# Patient Record
Sex: Female | Born: 1980 | Hispanic: Yes | Marital: Married | Smoking: Never smoker
Health system: Southern US, Community
[De-identification: ages and names within clinical notes are randomized; demographics above are authoritative.]

## PROBLEM LIST (undated history)

## (undated) DIAGNOSIS — Z789 Other specified health status: Secondary | ICD-10-CM

## (undated) HISTORY — PX: NO PAST SURGERIES: SHX2092

## (undated) HISTORY — DX: Other specified health status: Z78.9

---

## 2012-07-27 NOTE — L&D Delivery Note (Signed)
  Carly Graham, Carly Graham [[id number]]  Delivery Note At 3:06 PM a viable female was delivered via Vaginal, Spontaneous Delivery (Presentation: Right Occiput Anterior).  APGAR: 9, 9; weight 5 lb 5.4 oz (2421 g).   Placenta status: Intact, Spontaneous.  Cord: 3 vessels with the following complications: None.     Mom to postpartum.  Baby to nursery-stable.  Tawana Scale 03/22/2013, 3:57 PM     Carly Graham, Carly Graham [[id number]]  Delivery Note At 3:09 PM a viable female was delivered via  (Presentation: breech; weight 7 lb 3.2 oz (3265 g).   After delivery of baby A, feet readily grabbed and AROM with clear fluid.  Normal breech extraction done.  Nuchal arm on right, reduced by rotation and delivery of left arm. Placenta status: Spontaneous.  Cord: 3-V    Anesthesia:  Local, fentanyl PP Episiotomy: None Lacerations: parallel 2nd degree tear repaired in usual fashion. Suture Repair: 3.0 vicryl On cT1 Est. Blood Loss (mL): 350  Mom to postpartum.  Baby to nursery-stable.  Twin A delviered vertex by Dr. Ike Bene, twin B delivered Breech by Dr. Shawnie Pons.  Active management of third stage with pit and traction and spont delivery of both placenta at the same time. Repaired 2nd deg in usual fashion. EBL 350. Spone count 10/10, instrument count 12.  Tawana Scale 03/22/2013, 3:57 PM

## 2012-10-04 LAB — CYTOLOGY - PAP: CYTOLOGY - PAP: NORMAL

## 2012-10-04 LAB — OB RESULTS CONSOLE ABO/RH: RH Type: POSITIVE

## 2012-10-04 LAB — OB RESULTS CONSOLE HEPATITIS B SURFACE ANTIGEN: Hepatitis B Surface Ag: NEGATIVE

## 2012-10-04 LAB — OB RESULTS CONSOLE RUBELLA ANTIBODY, IGM: Rubella: IMMUNE

## 2012-10-04 LAB — OB RESULTS CONSOLE ANTIBODY SCREEN: Antibody Screen: NEGATIVE

## 2012-10-04 LAB — OB RESULTS CONSOLE HIV ANTIBODY (ROUTINE TESTING): HIV: NONREACTIVE

## 2012-12-26 ENCOUNTER — Encounter: Payer: Self-pay | Admitting: Obstetrics and Gynecology

## 2012-12-26 ENCOUNTER — Ambulatory Visit (INDEPENDENT_AMBULATORY_CARE_PROVIDER_SITE_OTHER): Payer: Self-pay | Admitting: Obstetrics and Gynecology

## 2012-12-26 VITALS — BP 99/64 | Ht <= 58 in | Wt 154.2 lb

## 2012-12-26 DIAGNOSIS — O30002 Twin pregnancy, unspecified number of placenta and unspecified number of amniotic sacs, second trimester: Secondary | ICD-10-CM

## 2012-12-26 DIAGNOSIS — O0992 Supervision of high risk pregnancy, unspecified, second trimester: Secondary | ICD-10-CM

## 2012-12-26 DIAGNOSIS — O30009 Twin pregnancy, unspecified number of placenta and unspecified number of amniotic sacs, unspecified trimester: Secondary | ICD-10-CM

## 2012-12-26 DIAGNOSIS — O24419 Gestational diabetes mellitus in pregnancy, unspecified control: Secondary | ICD-10-CM | POA: Insufficient documentation

## 2012-12-26 LAB — POCT URINALYSIS DIP (DEVICE)
Glucose, UA: NEGATIVE mg/dL
Nitrite: NEGATIVE
Urobilinogen, UA: 0.2 mg/dL (ref 0.0–1.0)

## 2012-12-26 NOTE — Progress Notes (Signed)
Nutrition note: 1st visit consult Pt is having twins. Pt has gained 17.2# @ [redacted]w[redacted]d, which is wnl.  Pt reports eating 4x/d and drinks mostly water & milk but drinks a big sweet tea from [employer] 2-3x/wk. Pt is taking PNV. Pt reports no N/V or heartburn. NKFA. Pt received verbal & written education in Spanish on nutrition while pregnant with twins via Raynelle Fanning, Nurse, learning disability. Encouraged pt to eat 6-8x/d and encouraged pt to decrease intake of sweet tea. Discussed wt gain goals of 31-50# or 1.3#/wk. Pt agrees to continue taking PNV & try to eat small frequent meals. Pt has WIC & plans to BF. F/u in 4-6 wks Blondell Reveal, MS, RD, LDN

## 2012-12-26 NOTE — Progress Notes (Signed)
Patient transferred care from [facility] department secondary to twin pregnancy. Prenatal records reviewed. Uncomplicated first pregnancy. Need to obtain anatomy ultrasound record. Will schedule growth ultrasound since last ultrasound was on 11/23/2012. Patient is otherwise doing well without complaints.Reviewed signs/symptoms of preterm labor. FM precautions also reviewed. 28 week labs at next visit

## 2012-12-26 NOTE — Progress Notes (Signed)
U/S scheduled 01/17/13 at 930 am.

## 2012-12-26 NOTE — Progress Notes (Signed)
Pulse: 82

## 2012-12-27 ENCOUNTER — Encounter: Payer: Self-pay | Admitting: *Deleted

## 2013-01-02 ENCOUNTER — Other Ambulatory Visit: Payer: Self-pay

## 2013-01-16 ENCOUNTER — Encounter: Payer: Self-pay | Admitting: Obstetrics and Gynecology

## 2013-01-16 ENCOUNTER — Ambulatory Visit (INDEPENDENT_AMBULATORY_CARE_PROVIDER_SITE_OTHER): Payer: Self-pay | Admitting: Obstetrics and Gynecology

## 2013-01-16 VITALS — BP 109/73 | Temp 97.9°F | Wt 164.7 lb

## 2013-01-16 DIAGNOSIS — O30002 Twin pregnancy, unspecified number of placenta and unspecified number of amniotic sacs, second trimester: Secondary | ICD-10-CM

## 2013-01-16 DIAGNOSIS — O30009 Twin pregnancy, unspecified number of placenta and unspecified number of amniotic sacs, unspecified trimester: Secondary | ICD-10-CM

## 2013-01-16 DIAGNOSIS — O0992 Supervision of high risk pregnancy, unspecified, second trimester: Secondary | ICD-10-CM

## 2013-01-16 LAB — POCT URINALYSIS DIP (DEVICE)
Glucose, UA: 100 mg/dL — AB
Nitrite: NEGATIVE
Protein, ur: NEGATIVE mg/dL
Urobilinogen, UA: 0.2 mg/dL (ref 0.0–1.0)

## 2013-01-16 NOTE — Progress Notes (Signed)
Patient doing well without any complaints. FM/PTL precautions reviewed. F/u growth ultrasound tomorrow. Patient had a lot of concern regarding cost of ultrasounds and whether or not they were necessary during pregnancy. Explained to patient that it is our only way to monitor growth in multiple gestation. Will refer to [facility] for financial assistance. Patient to have 1 hr GCT at next visit

## 2013-01-16 NOTE — Progress Notes (Signed)
Pulse: 93

## 2013-01-17 ENCOUNTER — Ambulatory Visit (HOSPITAL_COMMUNITY)
Admission: RE | Admit: 2013-01-17 | Discharge: 2013-01-17 | Disposition: A | Discharge status: Home / Self Care | Payer: Self-pay | Source: Ambulatory Visit | Attending: Obstetrics and Gynecology | Admitting: Obstetrics and Gynecology

## 2013-01-17 ENCOUNTER — Encounter: Payer: Self-pay | Admitting: Obstetrics and Gynecology

## 2013-01-17 DIAGNOSIS — O30002 Twin pregnancy, unspecified number of placenta and unspecified number of amniotic sacs, second trimester: Secondary | ICD-10-CM

## 2013-01-17 DIAGNOSIS — O30009 Twin pregnancy, unspecified number of placenta and unspecified number of amniotic sacs, unspecified trimester: Secondary | ICD-10-CM | POA: Insufficient documentation

## 2013-01-17 DIAGNOSIS — Z3689 Encounter for other specified antenatal screening: Secondary | ICD-10-CM | POA: Insufficient documentation

## 2013-01-30 ENCOUNTER — Ambulatory Visit (INDEPENDENT_AMBULATORY_CARE_PROVIDER_SITE_OTHER): Payer: Self-pay | Admitting: Family Medicine

## 2013-01-30 VITALS — BP 107/61 | Temp 97.6°F | Wt 164.0 lb

## 2013-01-30 DIAGNOSIS — O30002 Twin pregnancy, unspecified number of placenta and unspecified number of amniotic sacs, second trimester: Secondary | ICD-10-CM

## 2013-01-30 DIAGNOSIS — O30009 Twin pregnancy, unspecified number of placenta and unspecified number of amniotic sacs, unspecified trimester: Secondary | ICD-10-CM

## 2013-01-30 LAB — CBC
HCT: 32.5 % — ABNORMAL LOW (ref 36.0–46.0)
Hemoglobin: 11.4 g/dL — ABNORMAL LOW (ref 12.0–15.0)
MCH: 30.4 pg (ref 26.0–34.0)
MCHC: 35.1 g/dL (ref 30.0–36.0)

## 2013-01-30 LAB — POCT URINALYSIS DIP (DEVICE)
Ketones, ur: NEGATIVE mg/dL
Protein, ur: NEGATIVE mg/dL
Specific Gravity, Urine: 1.015 (ref 1.005–1.030)

## 2013-01-30 NOTE — Progress Notes (Signed)
Feeling tired, feet and back hurt. Last ultrasound showed concordant growth. Due for f/u ultrasound this week. 28-week labs. Worried about cost of care - awaiting results of application for financial assistance. Explained importance of monitoring twins for discordant growth. q 2 week growth sonos until 32 weeks then q 3 weeks and twice weekly testing starting at 32 weeks.

## 2013-01-30 NOTE — Progress Notes (Signed)
Pulse 94 Edema trace in lower extremities. C/o lower back and hip pain.

## 2013-01-30 NOTE — Patient Instructions (Addendum)
Embarazo  Tercer trimestre  (Pregnancy - Third Trimester) El tercer trimestre del embarazo (los ltimos 3 meses) es el perodo en el cual tanto usted como su beb crecen con ms rapidez. El beb alcanza un largo de aproximadamente 50 cm. y pesa entre 2,700 y 4,500 kg. El beb gana ms tejido graso y est listo para la vida fuera del cuerpo de la madre. Mientras estn en el interior, los bebs tienen perodos de sueo y vigilia, succionan el pulgar y tienen hipo. Quizs sienta pequeas contracciones del tero. Este es el falso trabajo de parto. Tambin se las conoce como contracciones de Braxton-Hicks . Es como una prctica del parto. Los problemas ms habituales de esta etapa del embarazo incluyen mayor dificultad para respirar, hinchazn de las manos y los pies por retencin de lquidos y la necesidad de orinar con ms frecuencia debido a que el tero y el beb presionan sobre la vejiga.  EXAMENES PRENATALES   Durante los exmenes prenatales, deber seguir realizndose anlisis de sangre. Estas pruebas se realizan para controlar su salud y la del beb. Los anlisis de sangre se realizan para conocer los niveles de algunos compuestos de la sangre (hemoglobina). La anemia (bajo nivel de hemoglobina) es frecuente durante el embarazo. Para prevenirla, se administran hierro y vitaminas. Tambin le tomarn nuevas anlisis para descartar diabetes. Podrn repetirle algunas de las pruebas que le hicieron previamente.  En cada visita le medirn el tamao del tero. Esto permite asegurar que el beb se desarrolla adecuadamente, segn la fecha del embarazo.  Le controlarn la presin arterial en cada visita prenatal. Esto es para asegurarse de que no sufre toxemia.  Le harn un anlisis de orina en cada visita prenatal, para descartar infecciones, diabetes y la presencia de protenas.  Tambin en cada visita controlarn su peso. Esto se realiza para asegurarse que aumenta de peso al ritmo indicado y que usted y su  beb evolucionan normalmente.  En algunas ocasiones se realiza una prueba de ultrasonido para confirmar el correcto desarrollo y evolucin del beb. Esta prueba se realiza con ondas sonoras inofensivas para el beb, de modo que el profesional pueda calcular ms precisamente la fecha del parto.  Analice con su mdico los analgsicos y la anestesia que recibir durante el trabajo de parto y el parto.  Comente la posibilidad de que necesite una cesrea y qu anestesia se recibir.  Informe a su mdico si sufre violencia familiar mental o fsica. A veces, se indica la prueba especializada sin estrs, la prueba de tolerancia a las contracciones y el perfil biofsico para asegurarse de que el beb no tiene problemas. El estudio del lquido amnitico que rodea al beb se llama amniocentesis. El lquido amnitico se obtiene introduciendo una aguja en el vientre (abdomen ). En ocasiones se lleva a cabo cerca del final del embarazo, si es necesario inducir a un parto. En este caso se realiza para asegurarse que los pulmones del beb estn lo suficientemente maduros como para que pueda vivir fuera del tero. Si los pulmones no han madurado y es peligroso que el beb nazca, se administrar a la madre una inyeccin de cortisona , 1 a 2 das antes del parto. . Esto ayuda a que los pulmones del beb maduren y sea ms seguro su nacimiento.  CAMBIOS QUE OCURREN EN EL TERCER TRIMESTRE DEL EMBARAZO  Su organismo atravesar numerosos cambios durante el embarazo. Estos pueden variar de una persona a otra. Converse con el profesional que la asiste acerca los cambios que   usted note y que la preocupen.   Durante el ltimo trimestre probablemente sienta un aumento del apetito. Es normal tener "antojos" de ciertas comidas. Esto vara de una persona a otra y de un embarazo a otro.  Podrn aparecer las primeras estras en las caderas, abdomen y mamas. Estos son cambios normales del cuerpo durante el embarazo. No existen  medicamentos ni ejercicios que puedan prevenir estos cambios.  La constipacin puede tratarse con un laxante o agregando fibra a su dieta. Beber grandes cantidades de lquidos, tomar fibras en forma de vegetales, frutas y granos integrales es de gran ayuda.  Tambin es beneficioso practicar actividad fsica. Si ha sido una persona activa hasta el embarazo, podr continuar con la mayora de las actividades durante el mismo. Si ha sido menos activa, puede ser beneficioso que comience con un programa de ejercicios, como realizar caminatas. Consulte con el profesional que la asiste antes de comenzar un programa de ejercicios.  Evite el consumo de cigarrillos, el alcohol, los medicamentos no recetados y las "drogas de la calle" durante el embarazo. Estas sustancias qumicas afectan la formacin y el desarrollo del beb. Evite estas sustancias durante todo el embarazo para asegurar el nacimiento de un beb sano.  Podr sentir dolor de espalda, tener vrices en las venas y hemorroides, o si ya los sufra, pueden empeorar.  Durante el tercer trimestre se cansar con ms facilidad, lo cual es normal.  Los movimientos del beb pueden ser ms fuertes y con ms frecuencia.  Puede que note dificultades para respirar normalmente.  El ombligo puede salir hacia afuera.  A veces sale una secrecin amarilla de las mamas, que se llama calostro.  Podr aparecer una secrecin mucosa con sangre. Esto suele ocurrir entre unos pocos das y una semana antes del parto. INSTRUCCIONES PARA EL CUIDADO EN EL HOGAR   Cumpla con las citas de control. Siga las indicaciones del mdico con respecto al uso de medicamentos, los ejercicios y la dieta.  Durante el embarazo debe obtener nutrientes para usted y para su beb. Consuma alimentos balanceados a intervalos regulares. Elija alimentos como carne, pescado, leche y otros productos lcteos descremados, vegetales, frutas, panes integrales y cereales. El mdico le informar  cul es el aumento de peso ideal.  Las relaciones sexuales pueden continuarse hasta casi el final del embarazo, si no se presentan otros problemas como prdida prematura (antes de tiempo) de lquido amnitico, hemorragia vaginal o dolor en el vientre (abdominal).  Realice actividad fsica todos los das, si no tiene restricciones. Consulte con el profesional que la asiste si no sabe con certeza si determinados ejercicios son seguros. El mayor aumento de peso se producir en los ltimos 2 trimestres del embarazo. El ejercicio ayuda a:  Controlar su peso.  Mantenerse en forma para el trabajo de parto y el parto .  Perder peso despus del parto.  Haga reposo con frecuencia, con las piernas elevadas, o segn lo necesite para evitar los calambres y el dolor de cintura.  Use un buen sostn o como los que se usan para hacer deportes para aliviar la sensibilidad de las mamas. Tambin puede serle til si lo usa mientras duerme. Si pierde calostro, podr utilizar apsitos en el sostn.  No utilice la baera con agua caliente, baos turcos y saunas.   Colquese el cinturn de seguridad cuando conduzca. Este la proteger a usted y al beb en caso de accidente.  Evite comer carne cruda y el contacto con los utensilios y desperdicios de los gatos. Estos   elementos contienen grmenes que pueden causar defectos de nacimiento en el beb.  Es fcil perder algo de orina durante el embarazo. Apretar y fortalecer los msculos de la pelvis la ayudar con este problema. Practique detener la miccin cuando est en el bao. Estos son los mismos msculos que necesita fortalecer. Son tambin los mismos msculos que utiliza cuando trata de evitar despedir gases. Puede practicar apretando estos msculos diez veces, y repetir esto tres veces por da aproximadamente. Una vez que conozca qu msculos debe apretar, no realice estos ejercicios durante la miccin. Puede favorecerle una infeccin si la orina vuelve hacia  atrs.  Pida ayuda si tienen necesidades financieras, teraputicas o nutricionales. El profesional podr ayudarla con respecto a estas necesidades, o derivarla a otros especialistas.  Haga una lista de nmeros telefnicos de emergencia y tngalos disponibles.  Planifique como obtener ayuda de familiares o amigos cuando regrese a casa desde el hospital.  Hacer un ensayo sobre la partida al hospital.  Tome clases prenatales con el padre para entender, practicar y hacer preguntas sobre el trabajo de parto y el alumbramiento.  Preparar la habitacin del beb / busque una guardera.  No viaje fuera de la ciudad a menos que sea absolutamente necesario y con el asesoramiento de su mdico.  Use slo zapatos de tacn bajo o sin tacn para tener mejor equilibrio y evitar cadas. USO DE MEDICAMENTOS Y CONSUMO DE DROGAS DURANTE EL EMBARAZO   Tome las vitaminas apropiadas para esta etapa tal como se le indic. Las vitaminas deben contener un miligramo de cido flico. Guarde todas las vitaminas fuera del alcance de los nios. La ingestin de slo un par de vitaminas o tabletas que contengan hierro pueden ocasionar la muerte en un beb o en un nio pequeo.  Evite el uso de todos los medicamentos, incluyendo hierbas, medicamentos de venta libre, sin receta o que no hayan sido sugeridos por su mdico. Slo tome medicamentos de venta libre o medicamentos recetados para el dolor, el malestar o fiebre como lo indique su mdico. No tome aspirina, ibuprofeno o naproxeno excepto que su mdico se lo indique.  Infrmele al profesional si consume alguna droga.  El alcohol se relaciona con ciertos defectos congnitos. Incluye el sndrome de alcoholismo fetal. Debe evitar absolutamente el consumo de alcohol, en cualquier forma. El fumar produce baja tasa de natalidad y bebs prematuros.  Las drogas ilegales o de la calle son muy perjudiciales para el beb. Estn absolutamente prohibidas. Un beb que nace de una  madre adicta, ser adicto al nacer. Ese beb tendr los mismos sntomas de abstinencia que un adulto. SOLICITE ATENCIN MDICA SI:  Tiene preguntas o preocupaciones relacionadas con el embarazo. Es mejor que llame para formular las preguntas si no puede esperar hasta la prxima visita, que sentirse preocupada por ellas.  SOLICITE ATENCIN MDICA DE INMEDIATO SI:   La temperatura oral le sube a ms de 38,9 C (102 F) o lo que su mdico le indique.  Tiene una prdida de lquido por la vagina (canal de parto). Si sospecha una ruptura de las membranas, tmese la temperatura y llame al profesional para informarlo sobre esto.  Observa unas pequeas manchas, una hemorragia vaginal o elimina cogulos. Notifique al profesional acerca de la cantidad y de cuntos apsitos est utilizando.  Presenta un olor desagradable en la secrecin vaginal y observa un cambio en el color, de transparente a blanco.  Ha vomitado durante ms de 24 horas.  Siente escalofros o le sube la fiebre.  Le   falta el aire.  Siente ardor al orinar.  Baja o sube ms de 2 libras (900 g), o segn lo indicado por el profesional que la asiste.  Observa que sbitamente se le hinchan el rostro, las manos, los pies o las piernas.  Siente dolor en el vientre (abdominal). Las molestias en el ligamento redondo son una causa benigna frecuente de dolor abdominal durante el embarazo. El profesional que la asiste deber evaluarla.  Presenta dolor de cabeza intenso que no se alivia.  Tiene problemas visuales, visin doble o borrosa.  Si no siente los movimientos del beb durante ms de 1 hora. Si piensa que el beb no se mueve tanto como lo haca habitualmente, coma algo que contenga azcar y recustese sobre el lado izquierdo durante una hora. El beb debe moverse al menos 4  5 veces por hora. Comunquese inmediatamente si el beb se mueve menos que lo indicado.  Se cae, se ve involucrada en un accidente automovilstico o sufre algn  tipo de traumatismo.  En su hogar hay violencia mental o fsica. Document Released: 04/22/2005 Document Revised: 04/06/2012 [employer] Patient Information 2014 [employer].  

## 2013-01-31 LAB — HIV ANTIBODY (ROUTINE TESTING W REFLEX): HIV: NONREACTIVE

## 2013-01-31 LAB — GLUCOSE TOLERANCE, 1 HOUR (50G) W/O FASTING: Glucose, 1 Hour GTT: 117 mg/dL (ref 70–140)

## 2013-02-02 ENCOUNTER — Telehealth: Payer: Self-pay | Admitting: *Deleted

## 2013-02-02 ENCOUNTER — Encounter: Payer: Self-pay | Admitting: Family Medicine

## 2013-02-02 NOTE — Telephone Encounter (Signed)
Called and scheduled ultrasound for first available appointment 02/08/13

## 2013-02-02 NOTE — Telephone Encounter (Signed)
[place] is 02/08/13 at 9:30 am. Need to call with Spanish interpreter

## 2013-02-02 NOTE — Telephone Encounter (Signed)
Message copied by Gerome Apley on Thu Feb 02, 2013  3:51 PM ------      Message from: FERRY, [place]      Created: Mon Jan 30, 2013 11:43 PM      Regarding: needs ultrasound scheduled for f/u growth this week or early next       Ultrasound for growth ordered. Needs this week or early next. Will order through [facility]. ------

## 2013-02-06 NOTE — Telephone Encounter (Signed)
Used Carly Graham as Equities trader. Called patient; no answer. Left message in VM of her ultrasound appt on 02/08/13 @0930 . Left number of clinic to call us back if she has any further questions.

## 2013-02-08 ENCOUNTER — Ambulatory Visit (HOSPITAL_COMMUNITY)
Admission: RE | Admit: 2013-02-08 | Discharge: 2013-02-08 | Disposition: A | Discharge status: Home / Self Care | Payer: Self-pay | Source: Ambulatory Visit | Attending: Family Medicine | Admitting: Family Medicine

## 2013-02-08 VITALS — BP 116/66 | HR 93 | Wt 169.5 lb

## 2013-02-08 DIAGNOSIS — N83209 Unspecified ovarian cyst, unspecified side: Secondary | ICD-10-CM | POA: Insufficient documentation

## 2013-02-08 DIAGNOSIS — O30002 Twin pregnancy, unspecified number of placenta and unspecified number of amniotic sacs, second trimester: Secondary | ICD-10-CM

## 2013-02-08 DIAGNOSIS — O30009 Twin pregnancy, unspecified number of placenta and unspecified number of amniotic sacs, unspecified trimester: Secondary | ICD-10-CM | POA: Insufficient documentation

## 2013-02-08 DIAGNOSIS — Z3689 Encounter for other specified antenatal screening: Secondary | ICD-10-CM | POA: Insufficient documentation

## 2013-02-08 DIAGNOSIS — O34599 Maternal care for other abnormalities of gravid uterus, unspecified trimester: Secondary | ICD-10-CM | POA: Insufficient documentation

## 2013-02-08 NOTE — Progress Notes (Signed)
Shaolin Mozel Burdett  was seen today for an ultrasound appointment.  See full report in [facility].  Impression: MC/DA twin gestation with best dates of 31 1/7 weeks 29% inter twin growth discordance is noted  Twin A: Maternal left, cephalic, anterior placenta Estimated fetal weight at the 29th %tile. No anomalies identied; limited views of the anatomy obtained due to late gestational age and position Normal amniotic fluid volume  Twin B: Maternal right, breech, anterior placenta Estimated fetal weight at the 55th %tile. No anomalies identified; limited views of the anatomy obtained due to late gestational age and position Normal amniotic fluid volume  Recommendations: The patient is unable to be seen for 2x weekly NSTs due to transportation issues.  She will return for weekly BPPs and fluid checks. Follow up ultrasound for interval growth in 3-4 weeks.  Recommend delivery at 37 weeks if testing otherwise reassuring.  Alpha Gula, MD

## 2013-02-13 ENCOUNTER — Encounter: Payer: Self-pay | Admitting: Obstetrics and Gynecology

## 2013-02-13 ENCOUNTER — Ambulatory Visit (INDEPENDENT_AMBULATORY_CARE_PROVIDER_SITE_OTHER): Payer: Self-pay | Admitting: Obstetrics and Gynecology

## 2013-02-13 VITALS — BP 104/70 | Temp 99.2°F | Wt 170.0 lb

## 2013-02-13 DIAGNOSIS — O0992 Supervision of high risk pregnancy, unspecified, second trimester: Secondary | ICD-10-CM

## 2013-02-13 DIAGNOSIS — O30002 Twin pregnancy, unspecified number of placenta and unspecified number of amniotic sacs, second trimester: Secondary | ICD-10-CM

## 2013-02-13 DIAGNOSIS — O30009 Twin pregnancy, unspecified number of placenta and unspecified number of amniotic sacs, unspecified trimester: Secondary | ICD-10-CM

## 2013-02-13 DIAGNOSIS — O24419 Gestational diabetes mellitus in pregnancy, unspecified control: Secondary | ICD-10-CM

## 2013-02-13 DIAGNOSIS — O99810 Abnormal glucose complicating pregnancy: Secondary | ICD-10-CM

## 2013-02-13 LAB — POCT URINALYSIS DIP (DEVICE)
Glucose, UA: NEGATIVE mg/dL
Specific Gravity, Urine: 1.01 (ref 1.005–1.030)
Urobilinogen, UA: 0.2 mg/dL (ref 0.0–1.0)

## 2013-02-13 NOTE — Progress Notes (Signed)
Pulse 86 Edema trace in  Hands and feet especially the left foot. C/o low back pain.

## 2013-02-13 NOTE — Progress Notes (Signed)
Patient doing well without complaints. FM/PTL precautions reviewed. Patient scheduled for weekly BPP with AFI on Wednesdays, in lieu of twice weekly testing (as she is having transportation issues)

## 2013-02-14 ENCOUNTER — Other Ambulatory Visit (HOSPITAL_COMMUNITY): Payer: Self-pay | Admitting: Maternal and Fetal Medicine

## 2013-02-14 DIAGNOSIS — O30002 Twin pregnancy, unspecified number of placenta and unspecified number of amniotic sacs, second trimester: Secondary | ICD-10-CM

## 2013-02-15 ENCOUNTER — Ambulatory Visit (HOSPITAL_COMMUNITY)
Admission: RE | Admit: 2013-02-15 | Discharge: 2013-02-15 | Disposition: A | Discharge status: Home / Self Care | Payer: Self-pay | Source: Ambulatory Visit | Attending: Obstetrics and Gynecology | Admitting: Obstetrics and Gynecology

## 2013-02-15 VITALS — BP 110/65 | HR 69 | Wt 172.0 lb

## 2013-02-15 DIAGNOSIS — O30009 Twin pregnancy, unspecified number of placenta and unspecified number of amniotic sacs, unspecified trimester: Secondary | ICD-10-CM | POA: Insufficient documentation

## 2013-02-15 DIAGNOSIS — O30039 Twin pregnancy, monochorionic/diamniotic, unspecified trimester: Secondary | ICD-10-CM | POA: Insufficient documentation

## 2013-02-15 DIAGNOSIS — O30002 Twin pregnancy, unspecified number of placenta and unspecified number of amniotic sacs, second trimester: Secondary | ICD-10-CM

## 2013-02-15 DIAGNOSIS — O30033 Twin pregnancy, monochorionic/diamniotic, third trimester: Secondary | ICD-10-CM

## 2013-02-15 NOTE — Progress Notes (Signed)
[facility] ultrasound  Indication: 32 yr old G3P1011 at [redacted]w[redacted]d with monochorionic/diamniotic twin gestation for BPPs.  Findings: 1. Monochorionic/diamniotic twin gestation; the dividing membrane is seen. 2. Anterior placenta without evidence of previa. 3. Normal amniotic fluid volume for both fetuses. 4. Normal stomach and bladder seen in each fetus. 5. Normal biophysical profiles for both fetuses of 8/8.  Recommendations: 1. Monochorionic/diamniotic twin gestation: - previously counseled - no evidence of twin twin transfusion on today's ultrasound; continue close surveillance - recommend fetal growth in 2 weeks - recommend continue antenatal testing - recommend delivery at 37 weeks if clinical picture remains stable - recommend preterm labor precautions  Eulis Foster, MD

## 2013-02-20 ENCOUNTER — Encounter: Payer: Self-pay | Admitting: Family Medicine

## 2013-02-21 ENCOUNTER — Other Ambulatory Visit (HOSPITAL_COMMUNITY): Payer: Self-pay | Admitting: Obstetrics and Gynecology

## 2013-02-22 ENCOUNTER — Ambulatory Visit (HOSPITAL_COMMUNITY)
Admission: RE | Admit: 2013-02-22 | Discharge: 2013-02-22 | Disposition: A | Discharge status: Home / Self Care | Payer: Self-pay | Source: Ambulatory Visit | Attending: Obstetrics and Gynecology | Admitting: Obstetrics and Gynecology

## 2013-02-22 ENCOUNTER — Inpatient Hospital Stay (HOSPITAL_COMMUNITY): Admission: RE | Admit: 2013-02-22 | Payer: Self-pay | Source: Ambulatory Visit

## 2013-02-22 DIAGNOSIS — O30009 Twin pregnancy, unspecified number of placenta and unspecified number of amniotic sacs, unspecified trimester: Secondary | ICD-10-CM | POA: Insufficient documentation

## 2013-02-22 DIAGNOSIS — O30033 Twin pregnancy, monochorionic/diamniotic, third trimester: Secondary | ICD-10-CM

## 2013-02-22 DIAGNOSIS — N83209 Unspecified ovarian cyst, unspecified side: Secondary | ICD-10-CM | POA: Insufficient documentation

## 2013-02-22 DIAGNOSIS — O34599 Maternal care for other abnormalities of gravid uterus, unspecified trimester: Secondary | ICD-10-CM | POA: Insufficient documentation

## 2013-02-27 ENCOUNTER — Encounter: Payer: Self-pay | Admitting: Family Medicine

## 2013-02-27 ENCOUNTER — Encounter: Payer: Self-pay | Admitting: Obstetrics & Gynecology

## 2013-02-27 ENCOUNTER — Ambulatory Visit (INDEPENDENT_AMBULATORY_CARE_PROVIDER_SITE_OTHER): Payer: Self-pay | Admitting: Family Medicine

## 2013-02-27 VITALS — BP 101/66 | Wt 173.8 lb

## 2013-02-27 DIAGNOSIS — O30002 Twin pregnancy, unspecified number of placenta and unspecified number of amniotic sacs, second trimester: Secondary | ICD-10-CM

## 2013-02-27 DIAGNOSIS — O30009 Twin pregnancy, unspecified number of placenta and unspecified number of amniotic sacs, unspecified trimester: Secondary | ICD-10-CM

## 2013-02-27 LAB — POCT URINALYSIS DIP (DEVICE)
Bilirubin Urine: NEGATIVE
Hgb urine dipstick: NEGATIVE
Ketones, ur: NEGATIVE mg/dL
Nitrite: NEGATIVE
pH: 7 (ref 5.0–8.0)

## 2013-02-27 NOTE — Progress Notes (Signed)
Pt is a 32 yo G3P1011 @ [redacted]w[redacted]d with mono/di twins.  - no ctx, lof, vb - +FM  O; see above  A/P:  - getting weekly BPP and AFI on Wednesdays due to transportation issues instead of twice weekly BPPs - has a growth scan for this week- last one was concordant - f/u @ 36 weeks with cultures at that time - discussed BTL- pt does not need papers.   - FM/PTL precautions discussed.

## 2013-02-27 NOTE — Progress Notes (Signed)
Pulse: 83

## 2013-02-27 NOTE — Patient Instructions (Signed)

## 2013-02-28 ENCOUNTER — Other Ambulatory Visit (HOSPITAL_COMMUNITY): Payer: Self-pay | Admitting: Maternal and Fetal Medicine

## 2013-02-28 DIAGNOSIS — O30002 Twin pregnancy, unspecified number of placenta and unspecified number of amniotic sacs, second trimester: Secondary | ICD-10-CM

## 2013-03-01 ENCOUNTER — Ambulatory Visit (HOSPITAL_COMMUNITY)
Admission: RE | Admit: 2013-03-01 | Discharge: 2013-03-01 | Disposition: A | Discharge status: Home / Self Care | Payer: Self-pay | Source: Ambulatory Visit | Attending: Obstetrics and Gynecology | Admitting: Obstetrics and Gynecology

## 2013-03-01 VITALS — BP 98/58 | HR 102 | Wt 178.5 lb

## 2013-03-01 DIAGNOSIS — O30039 Twin pregnancy, monochorionic/diamniotic, unspecified trimester: Secondary | ICD-10-CM | POA: Insufficient documentation

## 2013-03-01 DIAGNOSIS — O30009 Twin pregnancy, unspecified number of placenta and unspecified number of amniotic sacs, unspecified trimester: Secondary | ICD-10-CM | POA: Insufficient documentation

## 2013-03-01 DIAGNOSIS — Z3689 Encounter for other specified antenatal screening: Secondary | ICD-10-CM | POA: Insufficient documentation

## 2013-03-01 DIAGNOSIS — O30002 Twin pregnancy, unspecified number of placenta and unspecified number of amniotic sacs, second trimester: Secondary | ICD-10-CM

## 2013-03-01 NOTE — Progress Notes (Signed)
Carly Graham  was seen today for an ultrasound appointment.  See full report in [facility].  Impression: MC/DA twin gestation with best dates of 34 1/7 weeks Interval fetal growth is concordant  Twin A: Maternal left, cephalic, anterior placenta Active fetus with BPP of 8/8 Interval growth is appropriate (31st %tile) Normal amniotic fluid volume  Twin B: Maternal right, breech, anterior placenta Active fetus with BPP of 8/9 Interval growth is appropriate (50th %tile) Normal amniotic fluid volume  Recommendations: Continue weekly BPPs Recommend delivery at 37 weeks if otherwise uncomplicated.  Alpha Gula, MD

## 2013-03-08 ENCOUNTER — Ambulatory Visit (HOSPITAL_COMMUNITY)
Admission: RE | Admit: 2013-03-08 | Discharge: 2013-03-08 | Disposition: A | Discharge status: Home / Self Care | Payer: Self-pay | Source: Ambulatory Visit | Attending: Obstetrics and Gynecology | Admitting: Obstetrics and Gynecology

## 2013-03-08 ENCOUNTER — Other Ambulatory Visit (HOSPITAL_COMMUNITY): Payer: Self-pay | Admitting: Maternal and Fetal Medicine

## 2013-03-08 ENCOUNTER — Encounter (HOSPITAL_COMMUNITY): Payer: Self-pay

## 2013-03-08 VITALS — BP 141/87 | HR 100 | Wt 178.0 lb

## 2013-03-08 DIAGNOSIS — O30002 Twin pregnancy, unspecified number of placenta and unspecified number of amniotic sacs, second trimester: Secondary | ICD-10-CM

## 2013-03-08 DIAGNOSIS — O30039 Twin pregnancy, monochorionic/diamniotic, unspecified trimester: Secondary | ICD-10-CM | POA: Insufficient documentation

## 2013-03-08 DIAGNOSIS — Z3689 Encounter for other specified antenatal screening: Secondary | ICD-10-CM | POA: Insufficient documentation

## 2013-03-08 DIAGNOSIS — O30009 Twin pregnancy, unspecified number of placenta and unspecified number of amniotic sacs, unspecified trimester: Secondary | ICD-10-CM | POA: Insufficient documentation

## 2013-03-08 NOTE — Progress Notes (Signed)
[facility] ultrasound  Indication: 32 yr old G3P1011 at [redacted]w[redacted]d with monochorionic/diamniotic twin gestation for BPPs.  Findings: 1. Monochorionic/diamniotic twin gestation; the dividing membrane is seen. 2. Anterior placenta without evidence of previa. 3. Normal amniotic fluid volume for both fetuses. 4. Normal stomach and bladder seen in each fetus. 5. Normal biophysical profiles for both fetuses of 8/8. 6. Twin A is in cephalic presentation; twin B is in breech presentation.  Recommendations: 1. Monochorionic/diamniotic twin gestation: - previously counseled - no evidence of twin twin transfusion on today's ultrasound; continue close surveillance - recent growth was normal with 10% discordance - recommend continue antenatal testing - recommend delivery at 37 weeks if clinical picture remains stable - recommend preterm labor precautions  Patient reports nasal and throat congestion. no fever. Recommend try claritin, allegra, or zyrtec. If does not improve, develops a fever, or sore throat to be evaluated by primary doctor.   Eulis Foster, MD

## 2013-03-13 ENCOUNTER — Ambulatory Visit (INDEPENDENT_AMBULATORY_CARE_PROVIDER_SITE_OTHER): Payer: Self-pay | Admitting: Family

## 2013-03-13 VITALS — BP 107/68 | Temp 98.2°F | Wt 180.2 lb

## 2013-03-13 DIAGNOSIS — O30002 Twin pregnancy, unspecified number of placenta and unspecified number of amniotic sacs, second trimester: Secondary | ICD-10-CM

## 2013-03-13 DIAGNOSIS — O30009 Twin pregnancy, unspecified number of placenta and unspecified number of amniotic sacs, unspecified trimester: Secondary | ICD-10-CM

## 2013-03-13 DIAGNOSIS — O30003 Twin pregnancy, unspecified number of placenta and unspecified number of amniotic sacs, third trimester: Secondary | ICD-10-CM

## 2013-03-13 DIAGNOSIS — O0992 Supervision of high risk pregnancy, unspecified, second trimester: Secondary | ICD-10-CM

## 2013-03-13 LAB — OB RESULTS CONSOLE GC/CHLAMYDIA: Chlamydia: NEGATIVE

## 2013-03-13 LAB — POCT URINALYSIS DIP (DEVICE)
Bilirubin Urine: NEGATIVE
Glucose, UA: NEGATIVE mg/dL
Specific Gravity, Urine: 1.015 (ref 1.005–1.030)
Urobilinogen, UA: 0.2 mg/dL (ref 0.0–1.0)

## 2013-03-13 NOTE — Progress Notes (Signed)
Pulse: 83

## 2013-03-13 NOTE — Progress Notes (Signed)
Reports increased pressure; does not feel contractions, increased movement.  Reviewed preterm labor precautions; BPP/AFI on Wednesday; GBS and GC/CT collected today.

## 2013-03-14 LAB — GC/CHLAMYDIA PROBE AMP: CT Probe RNA: NEGATIVE

## 2013-03-15 ENCOUNTER — Ambulatory Visit (HOSPITAL_COMMUNITY)
Admission: RE | Admit: 2013-03-15 | Discharge: 2013-03-15 | Disposition: A | Discharge status: Home / Self Care | Payer: Self-pay | Source: Ambulatory Visit | Attending: Obstetrics and Gynecology | Admitting: Obstetrics and Gynecology

## 2013-03-15 DIAGNOSIS — O30002 Twin pregnancy, unspecified number of placenta and unspecified number of amniotic sacs, second trimester: Secondary | ICD-10-CM

## 2013-03-15 DIAGNOSIS — O30009 Twin pregnancy, unspecified number of placenta and unspecified number of amniotic sacs, unspecified trimester: Secondary | ICD-10-CM | POA: Insufficient documentation

## 2013-03-15 DIAGNOSIS — Z3689 Encounter for other specified antenatal screening: Secondary | ICD-10-CM | POA: Insufficient documentation

## 2013-03-15 DIAGNOSIS — O30039 Twin pregnancy, monochorionic/diamniotic, unspecified trimester: Secondary | ICD-10-CM | POA: Insufficient documentation

## 2013-03-15 NOTE — Progress Notes (Signed)
Carly Graham  was seen today for an ultrasound appointment.  See full report in [facility].  Impression: MC/DA twin gestation at 107 1/7 weeks Cephalic/ Breech presentation BPP 8/8 x 2 Normal amniotic fluid volume x 2  Recommendations: Follow-up ultrasounds as clinically indicated.  Recommend delivery at 37 weeks if otherwise stable.  Alpha Gula, MD

## 2013-03-16 LAB — CULTURE, BETA STREP (GROUP B ONLY)

## 2013-03-17 ENCOUNTER — Encounter: Payer: Self-pay | Admitting: Family

## 2013-03-20 ENCOUNTER — Ambulatory Visit (INDEPENDENT_AMBULATORY_CARE_PROVIDER_SITE_OTHER): Payer: Self-pay | Admitting: Family Medicine

## 2013-03-20 ENCOUNTER — Encounter: Payer: Self-pay | Admitting: Family Medicine

## 2013-03-20 ENCOUNTER — Telehealth (HOSPITAL_COMMUNITY): Payer: Self-pay | Admitting: *Deleted

## 2013-03-20 ENCOUNTER — Encounter (HOSPITAL_COMMUNITY): Payer: Self-pay | Admitting: *Deleted

## 2013-03-20 VITALS — BP 109/72 | Temp 97.5°F | Wt 180.1 lb

## 2013-03-20 DIAGNOSIS — O30002 Twin pregnancy, unspecified number of placenta and unspecified number of amniotic sacs, second trimester: Secondary | ICD-10-CM

## 2013-03-20 DIAGNOSIS — O30009 Twin pregnancy, unspecified number of placenta and unspecified number of amniotic sacs, unspecified trimester: Secondary | ICD-10-CM

## 2013-03-20 LAB — POCT URINALYSIS DIP (DEVICE)
Bilirubin Urine: NEGATIVE
Nitrite: NEGATIVE
Protein, ur: NEGATIVE mg/dL
pH: 7 (ref 5.0–8.0)

## 2013-03-20 NOTE — Telephone Encounter (Signed)
Preadmission screen Interpreter number [phone]

## 2013-03-20 NOTE — Assessment & Plan Note (Signed)
Delivery at 37 wks.

## 2013-03-20 NOTE — Progress Notes (Signed)
Needs IOL scheduled.  Babies are vtx, breech.  Recommend scheduling for Wednesday.

## 2013-03-20 NOTE — Patient Instructions (Signed)
Embarazo  Tercer trimestre  (Pregnancy - Third Trimester) El tercer trimestre del embarazo (los ltimos 3 meses) es el perodo en el cual tanto usted como su beb crecen con ms rapidez. El beb alcanza un largo de aproximadamente 50 cm. y pesa entre 2,700 y 4,500 kg. El beb gana ms tejido graso y est listo para la vida fuera del cuerpo de la madre. Mientras estn en el interior, los bebs tienen perodos de sueo y vigilia, succionan el pulgar y tienen hipo. Quizs sienta pequeas contracciones del tero. Este es el falso trabajo de parto. Tambin se las conoce como contracciones de Braxton-Hicks . Es como una prctica del parto. Los problemas ms habituales de esta etapa del embarazo incluyen mayor dificultad para respirar, hinchazn de las manos y los pies por retencin de lquidos y la necesidad de orinar con ms frecuencia debido a que el tero y el beb presionan sobre la vejiga.  EXAMENES PRENATALES   Durante los exmenes prenatales, deber seguir realizndose anlisis de sangre. Estas pruebas se realizan para controlar su salud y la del beb. Los anlisis de sangre se realizan para conocer los niveles de algunos compuestos de la sangre (hemoglobina). La anemia (bajo nivel de hemoglobina) es frecuente durante el embarazo. Para prevenirla, se administran hierro y vitaminas. Tambin le tomarn nuevas anlisis para descartar diabetes. Podrn repetirle algunas de las pruebas que le hicieron previamente.  En cada visita le medirn el tamao del tero. Esto permite asegurar que el beb se desarrolla adecuadamente, segn la fecha del embarazo.  Le controlarn la presin arterial en cada visita prenatal. Esto es para asegurarse de que no sufre toxemia.  Le harn un anlisis de orina en cada visita prenatal, para descartar infecciones, diabetes y la presencia de protenas.  Tambin en cada visita controlarn su peso. Esto se realiza para asegurarse que aumenta de peso al ritmo indicado y que usted y  su beb evolucionan normalmente.  En algunas ocasiones se realiza una prueba de ultrasonido para confirmar el correcto desarrollo y evolucin del beb. Esta prueba se realiza con ondas sonoras inofensivas para el beb, de modo que el profesional pueda calcular ms precisamente la fecha del parto.  Analice con su mdico los analgsicos y la anestesia que recibir durante el trabajo de parto y el parto.  Comente la posibilidad de que necesite una cesrea y qu anestesia se recibir.  Informe a su mdico si sufre violencia familiar mental o fsica. A veces, se indica la prueba especializada sin estrs, la prueba de tolerancia a las contracciones y el perfil biofsico para asegurarse de que el beb no tiene problemas. El estudio del lquido amnitico que rodea al beb se llama amniocentesis. El lquido amnitico se obtiene introduciendo una aguja en el vientre (abdomen ). En ocasiones se lleva a cabo cerca del final del embarazo, si es necesario inducir a un parto. En este caso se realiza para asegurarse que los pulmones del beb estn lo suficientemente maduros como para que pueda vivir fuera del tero. Si los pulmones no han madurado y es peligroso que el beb nazca, se administrar a la madre una inyeccin de cortisona , 1 a 2 das antes del parto. . Esto ayuda a que los pulmones del beb maduren y sea ms seguro su nacimiento.  CAMBIOS QUE OCURREN EN EL TERCER TRIMESTRE DEL EMBARAZO  Su organismo atravesar numerosos cambios durante el embarazo. Estos pueden variar de una persona a otra. Converse con el profesional que la asiste acerca los cambios que   usted note y que la preocupen.   Durante el ltimo trimestre probablemente sienta un aumento del apetito. Es normal tener "antojos" de ciertas comidas. Esto vara de una persona a otra y de un embarazo a otro.  Podrn aparecer las primeras estras en las caderas, abdomen y mamas. Estos son cambios normales del cuerpo durante el embarazo. No existen  medicamentos ni ejercicios que puedan prevenir estos cambios.  La constipacin puede tratarse con un laxante o agregando fibra a su dieta. Beber grandes cantidades de lquidos, tomar fibras en forma de vegetales, frutas y granos integrales es de gran ayuda.  Tambin es beneficioso practicar actividad fsica. Si ha sido una persona activa hasta el embarazo, podr continuar con la mayora de las actividades durante el mismo. Si ha sido menos activa, puede ser beneficioso que comience con un programa de ejercicios, como realizar caminatas. Consulte con el profesional que la asiste antes de comenzar un programa de ejercicios.  Evite el consumo de cigarrillos, el alcohol, los medicamentos no recetados y las "drogas de la calle" durante el embarazo. Estas sustancias qumicas afectan la formacin y el desarrollo del beb. Evite estas sustancias durante todo el embarazo para asegurar el nacimiento de un beb sano.  Podr sentir dolor de espalda, tener vrices en las venas y hemorroides, o si ya los sufra, pueden empeorar.  Durante el tercer trimestre se cansar con ms facilidad, lo cual es normal.  Los movimientos del beb pueden ser ms fuertes y con ms frecuencia.  Puede que note dificultades para respirar normalmente.  El ombligo puede salir hacia afuera.  A veces sale una secrecin amarilla de las mamas, que se llama calostro.  Podr aparecer una secrecin mucosa con sangre. Esto suele ocurrir entre unos pocos das y una semana antes del parto. INSTRUCCIONES PARA EL CUIDADO EN EL HOGAR   Cumpla con las citas de control. Siga las indicaciones del mdico con respecto al uso de medicamentos, los ejercicios y la dieta.  Durante el embarazo debe obtener nutrientes para usted y para su beb. Consuma alimentos balanceados a intervalos regulares. Elija alimentos como carne, pescado, leche y otros productos lcteos descremados, vegetales, frutas, panes integrales y cereales. El mdico le informar  cul es el aumento de peso ideal.  Las relaciones sexuales pueden continuarse hasta casi el final del embarazo, si no se presentan otros problemas como prdida prematura (antes de tiempo) de lquido amnitico, hemorragia vaginal o dolor en el vientre (abdominal).  Realice actividad fsica todos los das, si no tiene restricciones. Consulte con el profesional que la asiste si no sabe con certeza si determinados ejercicios son seguros. El mayor aumento de peso se producir en los ltimos 2 trimestres del embarazo. El ejercicio ayuda a:  Controlar su peso.  Mantenerse en forma para el trabajo de parto y el parto .  Perder peso despus del parto.  Haga reposo con frecuencia, con las piernas elevadas, o segn lo necesite para evitar los calambres y el dolor de cintura.  Use un buen sostn o como los que se usan para hacer deportes para aliviar la sensibilidad de las mamas. Tambin puede serle til si lo usa mientras duerme. Si pierde calostro, podr utilizar apsitos en el sostn.  No utilice la baera con agua caliente, baos turcos y saunas.   Colquese el cinturn de seguridad cuando conduzca. Este la proteger a usted y al beb en caso de accidente.  Evite comer carne cruda y el contacto con los utensilios y desperdicios de los gatos. Estos   elementos contienen grmenes que pueden causar defectos de nacimiento en el beb.  Es fcil perder algo de orina durante el embarazo. Apretar y fortalecer los msculos de la pelvis la ayudar con este problema. Practique detener la miccin cuando est en el bao. Estos son los mismos msculos que necesita fortalecer. Son tambin los mismos msculos que utiliza cuando trata de evitar despedir gases. Puede practicar apretando estos msculos diez veces, y repetir esto tres veces por da aproximadamente. Una vez que conozca qu msculos debe apretar, no realice estos ejercicios durante la miccin. Puede favorecerle una infeccin si la orina vuelve hacia  atrs.  Pida ayuda si tienen necesidades financieras, teraputicas o nutricionales. El profesional podr ayudarla con respecto a estas necesidades, o derivarla a otros especialistas.  Haga una lista de nmeros telefnicos de emergencia y tngalos disponibles.  Planifique como obtener ayuda de familiares o amigos cuando regrese a casa desde el hospital.  Hacer un ensayo sobre la partida al hospital.  Tome clases prenatales con el padre para entender, practicar y hacer preguntas sobre el trabajo de parto y el alumbramiento.  Preparar la habitacin del beb / busque una guardera.  No viaje fuera de la ciudad a menos que sea absolutamente necesario y con el asesoramiento de su mdico.  Use slo zapatos de tacn bajo o sin tacn para tener mejor equilibrio y evitar cadas. USO DE MEDICAMENTOS Y CONSUMO DE DROGAS DURANTE EL EMBARAZO   Tome las vitaminas apropiadas para esta etapa tal como se le indic. Las vitaminas deben contener un miligramo de cido flico. Guarde todas las vitaminas fuera del alcance de los nios. La ingestin de slo un par de vitaminas o tabletas que contengan hierro pueden ocasionar la muerte en un beb o en un nio pequeo.  Evite el uso de todos los medicamentos, incluyendo hierbas, medicamentos de venta libre, sin receta o que no hayan sido sugeridos por su mdico. Slo tome medicamentos de venta libre o medicamentos recetados para el dolor, el malestar o fiebre como lo indique su mdico. No tome aspirina, ibuprofeno o naproxeno excepto que su mdico se lo indique.  Infrmele al profesional si consume alguna droga.  El alcohol se relaciona con ciertos defectos congnitos. Incluye el sndrome de alcoholismo fetal. Debe evitar absolutamente el consumo de alcohol, en cualquier forma. El fumar produce baja tasa de natalidad y bebs prematuros.  Las drogas ilegales o de la calle son muy perjudiciales para el beb. Estn absolutamente prohibidas. Un beb que nace de una  madre adicta, ser adicto al nacer. Ese beb tendr los mismos sntomas de abstinencia que un adulto. SOLICITE ATENCIN MDICA SI:  Tiene preguntas o preocupaciones relacionadas con el embarazo. Es mejor que llame para formular las preguntas si no puede esperar hasta la prxima visita, que sentirse preocupada por ellas.  SOLICITE ATENCIN MDICA DE INMEDIATO SI:   La temperatura oral le sube a ms de 38,9 C (102 F) o lo que su mdico le indique.  Tiene una prdida de lquido por la vagina (canal de parto). Si sospecha una ruptura de las membranas, tmese la temperatura y llame al profesional para informarlo sobre esto.  Observa unas pequeas manchas, una hemorragia vaginal o elimina cogulos. Notifique al profesional acerca de la cantidad y de cuntos apsitos est utilizando.  Presenta un olor desagradable en la secrecin vaginal y observa un cambio en el color, de transparente a blanco.  Ha vomitado durante ms de 24 horas.  Siente escalofros o le sube la fiebre.  Le   falta el aire.  Siente ardor al orinar.  Baja o sube ms de 2 libras (900 g), o segn lo indicado por el profesional que la asiste.  Observa que sbitamente se le hinchan el rostro, las manos, los pies o las piernas.  Siente dolor en el vientre (abdominal). Las molestias en el ligamento redondo son una causa benigna frecuente de dolor abdominal durante el embarazo. El profesional que la asiste deber evaluarla.  Presenta dolor de cabeza intenso que no se alivia.  Tiene problemas visuales, visin doble o borrosa.  Si no siente los movimientos del beb durante ms de 1 hora. Si piensa que el beb no se mueve tanto como lo haca habitualmente, coma algo que contenga azcar y recustese sobre el lado izquierdo durante una hora. El beb debe moverse al menos 4  5 veces por hora. Comunquese inmediatamente si el beb se mueve menos que lo indicado.  Se cae, se ve involucrada en un accidente automovilstico o sufre algn  tipo de traumatismo.  En su hogar hay violencia mental o fsica. Document Released: 04/22/2005 Document Revised: 04/06/2012 [employer] Patient Information 2014 [employer].  Lactancia materna  (Breastfeeding)  El cambio hormonal durante el embarazo produce el desarrollo del tejido mamario y un aumento en el nmero y tamao de los conductos galactforos. La hormona prolactina permite que las protenas, los azcares y las grasas de la sangre produzcan la leche materna en las glndulas productoras de leche. La hormona progesterona impide que la leche materna sea liberada antes del nacimiento del beb. Despus del nacimiento del beb, su nivel de progesterona disminuye permitiendo que la leche materna sea liberada. Pensar en el beb, as como la succin o el llanto, pueden estimular la liberacin de leche de las glndulas productoras de leche.  La decisin de amamantar (lactar) es una de las mejores opciones que usted puede hacer para usted y su beb. La informacin que sigue da una breve resea de los beneficios, as como otras caractersticas importantes que debe saber sobre la lactancia materna.  LOS BENEFICIOS DE AMAMANTAR  Para el beb   La primera leche (calostro) ayuda al mejor funcionamiento del sistema digestivo del beb.   La leche tiene anticuerpos que provienen de la madre y que ayudan a prevenir las infecciones en el beb.   El beb tiene una menor incidencia de asma, alergias y del sndrome de muerte sbita del lactante (SMSL).   Los nutrientes de la leche materna son mejores para el beb que la leche maternizada.  La leche materna mejora el desarrollo cerebral del beb.   Su beb tendr menos gases, clicos y estreimiento.  Es menos probable que el beb desarrolle otras enfermedades, como obesidad infantil, asma o diabetes mellitus. Para usted   La lactancia materna favorece el desarrollo de un vnculo muy especial entre la madre y el beb.   Es ms conveniente,  siempre disponible, a la temperatura adecuada y econmica.   La lactancia materna ayuda a quemar caloras y a perder el peso ganado durante el embarazo.   Hace que el tero se contraiga ms rpidamente a su tamao normal y disminuye el sangrado despus del parto.   Las madres que amamantan tienen menos riesgo de desarrollar osteoporosis o cncer de mama o de ovario en el futuro.  FRECUENCIA DEL AMAMANTAMIENTO   Un beb sano, nacido a trmino, puede amamantarse con tanta frecuencia como cada hora, o espaciar las comidas cada tres horas. La frecuencia en la lactancia varan de un beb a   otro.   Los recin nacidos deben ser alimentados por lo menos cada 2-3 horas durante el da y cada 4-5 horas durante la noche. Usted debe amamantarlo un mnimo de 8 tomas en un perodo de 24 horas.  Despierte al beb para amamantarlo si han pasado 3-4 horas desde la ltima comida.  Amamante cuando sienta la necesidad de reducir la plenitud de sus senos o cuando el beb muestre signos de hambre. Las seales de que el beb puede tener hambre son:  Aumenta su estado de alerta o vigilancia.  Se estira.  Mueve la cabeza de un lado a otro.  Mueve la cabeza y abre la boca cuando se le toca la mejilla o la boca (reflejo de succin).  Aumenta las vocalizaciones, tales como sonidos de succin, relamerse los labios, arrullos, suspiros, o chirridos.  Mueve la mano hacia la boca.  Se chupa con ganas los dedos o las manos.  Agitacin.  Llanto intermitente.  Los signos de hambre extrema requerirn que lo calme y lo consuele antes de tratar de alimentarlo. Los signos de hambre extrema son:  Agitacin.  Llanto fuerte e intenso.  Gritos.  El amamantamiento frecuente la ayudar a producir ms leche y a prevenir problemas de dolor en los pezones e hinchazn de las mamas.  LACTANCIA MATERNA   Ya sea que se encuentre acostada o sentada, asegrese que el abdomen del beb est enfrente el suyo.   Sostenga  la mama con el pulgar por arriba y los otros 4 dedos por debajo del pezn. Asegrese que sus dedos se encuentren lejos del pezn y de la boca del beb.   Empuje suavemente los labios del beb con el pezn o con el dedo.   Cuando la boca del beb se abra lo suficiente, introduzca el pezn y la zona oscura que lo rodea (areola) tanto como le sea posible dentro de la boca.  Debe haber ms areola visible por arriba del labio superior que por debajo del labio inferior.  La lengua del beb debe estar entre la enca inferior y el seno.  Asegrese de que la boca del beb est en la posicin correcta alrededor del pezn (prendida). Los labios del beb deben crear un sello sobre su pecho.  Las seales de que el beb se ha prendido eficazmente al pezn son:  Tironea o succiona sin dolor.  Se escucha que traga entre las succiones.  No hace ruidos ni chasquidos.  Hay movimientos musculares por arriba y por delante de sus odos al succionar.  El beb debe succionar unos 2-3 minutos para que salga la leche. Permita que el nio se alimente en cada mama todo lo que desee. Alimente al beb hasta que se desprenda o se quede dormido en el primer pecho y luego ofrzcale el segundo pecho.  Las seales de que el beb est lleno y satisfecho son:  Disminuye gradualmente el nmero de succiones o no succiona.  Se queda dormido.  Extiende o relaja su cuerpo.  Retiene una pequea cantidad de leche en la boca.  Se desprende del pecho por s mismo.  Los signos de una lactancia materna eficaz son:  Los senos han aumentado la firmeza, el peso y el tamao antes de la alimentacin.  Son ms blandos despus de amamantar.  Un aumento del volumen de leche, y tambin el cambio de su consistencia y color se producen hacia el quinto da de lactancia materna.  La congestin mamaria se alivia al dar de mamar.  Los pezones no duelen,   ni estn agrietados ni sangran.  De ser necesario, interrumpa la succin  poniendo su dedo en la esquina de la boca del beb y deslizando el dedo entre sus encas. A continuacin, retire la mama de su boca.  Es comn que los bebs regurgiten un poco despus de comer.  A menudo los bebs tragan aire al alimentarse. Esto puede hacer que se sienta molesto. Hacer eructar al beb al cambiar de pecho puede ser de ayuda.  Se recomiendan suplementos de vitamina D para los bebs que reciben slo leche materna.  Evite el uso del chupete durante las primeras 4 a 6 semanas de vida.  Evite la alimentacin suplementaria con agua, frmula o jugo en lugar de la leche materna. La leche materna es todo el alimento que el beb necesita. No es necesario que el nio ingiera agua o preparados de bibern. Sus pechos producirn ms leche si se evita la alimentacin suplementaria durante las primeras semanas. COMO SABER SI EL BEB OBTIENE LA SUFICIENTE LECHE MATERNA  Preguntarse si el beb obtiene la cantidad suficiente de leche es una preocupacin frecuente entre las madres. Puede asegurarse que el beb tiene la leche suficiente si:   El beb succiona activamente y usted escucha que traga.   El beb parece estar relajado y satisfecho despus de mamar.   El nio se alimenta al menos 8 a 12 veces en 24 horas.  Durante los primeros 3 a 5 das de vida:  Moja 3-5 paales en 24 horas. La materia fecal debe ser blanda y amarillenta.  Tiene al menos 3 a 4 deposiciones en 24 horas. La materia fecal debe ser blanda y amarillenta.  A los 5-7 das de vida, el beb debe tener al menos 3-6 deposiciones en 24 horas. La materia fecal debe ser grumosa y amarilla a los 5 das de vida.  Su beb tiene una prdida de peso menor a 7al 10% durante los primeros 3 das de vida.  El beb no pierde peso despus de 3-7 das de vida.  El beb debe aumentar 4 a 6 libras (120 a 170 gr.) por semana despus de los 4 das de vida.  Aumenta de peso a los 5 das de vida y vuelve al peso del nacimiento dentro de  las 2 semanas. CONGESTIN MAMARIA  Durante la primera semana despus del parto, usted puede experimentar hinchazn en las mamas (congestin mamaria). Al estar congestionadas, las mamas se sienten pesadas, calientes o sensibles al tacto. El pico de la congestin ocurre a las 24 -48 horas despus del parto.   La congestin puede disminuirse:  Continuando con la lactancia materna.  Aumentando la frecuencia.  Tomando duchas calientes o aplicando calor hmedo en los senos antes de cada comida. Esto aumenta la circulacin y ayuda a que la leche fluya.   Masajeando suavemente el pecho antes y durante las comidas. Con las yemas de los dedos, masajee la pared del pecho hacia el pezn en un movimiento circular.   Asegurarse de que el beb vaca al menos uno de sus pechos en cada alimentacin. Tambin ayuda si comienza la siguiente toma en el otro seno.   Extraiga manualmente o con un sacaleches las mamas para vaciar los pechos si el beb tiene sueo o no se aliment bien. Tambin puede extraer la leche cuando vuelva a trabajar o si siente que se estn congestionando las mamas.  Asegrese de que el beb se prende y est bien colocado durante la lactancia. Si sigue estas indicaciones, la congestin debe mejorar   en 24 a 48 horas. Si an tiene dificultades, consulte a su asesor en lactancia.  CUDESE USTED MISMA  Cuide sus mamas.   Bese o dchese diariamente.   Evite usar jabn en los pezones.   Use un sostn de soporte Evite el uso de sostenes con aro.  Seque al aire sus pezones durante 3-4 minutos despus de cada comida.   Utilice slo apsitos de algodn en el sostn para absorber las prdidas de leche. La prdida de un poco de leche materna entre las comidas es normal.   Use solamente lanolina pura en sus pezones despus de amamantar. Usted no tiene que lavarla antes de alimentar al beb. Otra opcin es sacarse unas gotas de leche y masajear suavemente los pezones.  Continuar con  los autocontroles de la mama. Cudese.   Consuma alimentos saludables. Alterne 3 comidas con 3 colaciones.  Evite los alimentos que usted nota que perjudican al beb.  Beba leche, jugos de fruta y agua para satisfacer su sed (aproximadamente 8 vasos al da).   Descanse con frecuencia, reljese y tome sus vitaminas prenatales para evitar la fatiga, el estrs y la anemia.  Evite masticar y fumar tabaco.  Evite el consumo de alcohol y drogas.  Tome medicamentos de venta libre y recetados tal como le indic su mdico o farmacutico. Siempre debe consultar con su mdico o farmacutico antes de tomar cualquier medicamento, vitamina o suplemento de hierbas.  Sepa que durante la lactancia puede quedar embarazada. Si lo desea, hable con su mdico acerca de la planificacin familiar y los mtodos anticonceptivos seguros que puede utilizar durante la lactancia. SOLICITE ATENCIN MDICA SI:   Usted siente que quiere dejar de amamantar o se siente frustrada con la lactancia.  Siente dolor en los senos o en los pezones.  Sus pezones estn agrietados o sangran.  Sus pechos estn irritados, sensibles o calientes.  Tiene un rea hinchada en cualquiera de los senos.  Siente escalofros o fiebre.  Tiene nuseas o vmitos.  Observa un drenaje en los pezones.  Sus mamas no se llenan antes de amamantarlo al 5to da despus del parto.  Se siente triste y deprimida.  El nio est demasiado somnoliento como para comer.  El nio tiene problemas para respirar.   Moja menos de 3 paales en 24 horas.  Mueve el intestino menos de 3 veces en 24 horas.  La piel del beb o la parte blanca de sus ojos est ms amarilla.   El beb no ha aumentado de peso a los 5 das de vida. ASEGRESE DE QUE:   Comprende estas instrucciones.  Controlar su enfermedad.  Solicitar ayuda de inmediato si no mejora o si empeora. Document Released: 07/13/2005 Document Revised: 04/06/2012 [employer] Patient  Information 2014 [employer].  

## 2013-03-20 NOTE — Progress Notes (Signed)
Pulse- 69 Patient reports lower abdominal pressure and nonpainful contractions

## 2013-03-22 ENCOUNTER — Inpatient Hospital Stay (HOSPITAL_COMMUNITY)
Admission: RE | Admit: 2013-03-22 | Discharge: 2013-03-24 | DRG: 775 | Disposition: A | Discharge status: Home / Self Care | Payer: Medicaid Other | Source: Ambulatory Visit | Attending: Family Medicine | Admitting: Family Medicine

## 2013-03-22 ENCOUNTER — Encounter (HOSPITAL_COMMUNITY): Payer: Self-pay

## 2013-03-22 VITALS — BP 102/73 | HR 74 | Temp 98.4°F | Resp 18 | Ht <= 58 in | Wt 180.0 lb

## 2013-03-22 DIAGNOSIS — O30002 Twin pregnancy, unspecified number of placenta and unspecified number of amniotic sacs, second trimester: Secondary | ICD-10-CM

## 2013-03-22 DIAGNOSIS — O30009 Twin pregnancy, unspecified number of placenta and unspecified number of amniotic sacs, unspecified trimester: Secondary | ICD-10-CM

## 2013-03-22 DIAGNOSIS — O30039 Twin pregnancy, monochorionic/diamniotic, unspecified trimester: Secondary | ICD-10-CM

## 2013-03-22 DIAGNOSIS — O3090 Multiple gestation, unspecified, unspecified trimester: Principal | ICD-10-CM | POA: Diagnosis present

## 2013-03-22 DIAGNOSIS — O0992 Supervision of high risk pregnancy, unspecified, second trimester: Secondary | ICD-10-CM

## 2013-03-22 LAB — RPR: RPR Ser Ql: NONREACTIVE

## 2013-03-22 LAB — CBC
MCH: 29.1 pg (ref 26.0–34.0)
MCV: 86.5 fL (ref 78.0–100.0)
Platelets: 175 10*3/uL (ref 150–400)
RBC: 3.78 MIL/uL — ABNORMAL LOW (ref 3.87–5.11)
RDW: 13.4 % (ref 11.5–15.5)
WBC: 7.9 10*3/uL (ref 4.0–10.5)

## 2013-03-22 LAB — TYPE AND SCREEN

## 2013-03-22 MED ORDER — FENTANYL CITRATE 0.05 MG/ML IJ SOLN
50.0000 ug | Freq: Once | INTRAMUSCULAR | Status: AC
  Administered 2013-03-22: 50 ug via INTRAVENOUS

## 2013-03-22 MED ORDER — SIMETHICONE 80 MG PO CHEW: 80.0000 mg | CHEWABLE_TABLET | ORAL | Status: DC | PRN

## 2013-03-22 MED ORDER — OXYTOCIN 40 UNITS IN LACTATED RINGERS INFUSION - SIMPLE MED
1.0000 m[IU]/min | INTRAVENOUS | Status: DC
  Administered 2013-03-22: 1 m[IU]/min via INTRAVENOUS
  Filled 2013-03-22: qty 1000

## 2013-03-22 MED ORDER — NALBUPHINE SYRINGE 5 MG/0.5 ML
5.0000 mg | INJECTION | INTRAMUSCULAR | Status: DC | PRN
  Administered 2013-03-22: 5 mg via INTRAVENOUS
  Filled 2013-03-22 (×2): qty 0.5

## 2013-03-22 MED ORDER — PRENATAL MULTIVITAMIN CH
1.0000 | ORAL_TABLET | Freq: Every day | ORAL | Status: DC
  Administered 2013-03-23 – 2013-03-24 (×2): 1 via ORAL
  Filled 2013-03-22 (×3): qty 1

## 2013-03-22 MED ORDER — OXYCODONE-ACETAMINOPHEN 5-325 MG PO TABS
1.0000 | ORAL_TABLET | ORAL | Status: DC | PRN
  Administered 2013-03-23: 2 via ORAL
  Filled 2013-03-22: qty 2

## 2013-03-22 MED ORDER — LACTATED RINGERS IV SOLN: 500.0000 mL | INTRAVENOUS | Status: DC | PRN

## 2013-03-22 MED ORDER — LIDOCAINE HCL (PF) 1 % IJ SOLN
30.0000 mL | INTRAMUSCULAR | Status: AC | PRN
Start: 2013-03-22 — End: 2013-03-24
  Administered 2013-03-22: 30 mL via SUBCUTANEOUS
  Filled 2013-03-22 (×2): qty 30

## 2013-03-22 MED ORDER — ONDANSETRON HCL 4 MG/2ML IJ SOLN: 4.0000 mg | INTRAMUSCULAR | Status: DC | PRN

## 2013-03-22 MED ORDER — ACETAMINOPHEN 325 MG PO TABS: 650.0000 mg | ORAL_TABLET | ORAL | Status: DC | PRN

## 2013-03-22 MED ORDER — IBUPROFEN 600 MG PO TABS
600.0000 mg | ORAL_TABLET | Freq: Four times a day (QID) | ORAL | Status: DC
  Administered 2013-03-22 – 2013-03-24 (×7): 600 mg via ORAL
  Filled 2013-03-22 (×9): qty 1

## 2013-03-22 MED ORDER — FENTANYL CITRATE 0.05 MG/ML IJ SOLN
INTRAMUSCULAR | Status: AC
  Administered 2013-03-22: 50 ug via INTRAVENOUS
  Filled 2013-03-22: qty 2

## 2013-03-22 MED ORDER — DIBUCAINE 1 % RE OINT: 1.0000 "application " | TOPICAL_OINTMENT | RECTAL | Status: DC | PRN

## 2013-03-22 MED ORDER — DIPHENHYDRAMINE HCL 25 MG PO CAPS: 25.0000 mg | ORAL_CAPSULE | Freq: Four times a day (QID) | ORAL | Status: DC | PRN

## 2013-03-22 MED ORDER — ONDANSETRON HCL 4 MG PO TABS: 4.0000 mg | ORAL_TABLET | ORAL | Status: DC | PRN

## 2013-03-22 MED ORDER — SENNOSIDES-DOCUSATE SODIUM 8.6-50 MG PO TABS
2.0000 | ORAL_TABLET | Freq: Every day | ORAL | Status: DC
  Administered 2013-03-22 – 2013-03-24 (×2): 2 via ORAL

## 2013-03-22 MED ORDER — BENZOCAINE-MENTHOL 20-0.5 % EX AERO
1.0000 "application " | INHALATION_SPRAY | CUTANEOUS | Status: DC | PRN
  Administered 2013-03-23: 1 via TOPICAL
  Filled 2013-03-22: qty 56

## 2013-03-22 MED ORDER — METHYLERGONOVINE MALEATE 0.2 MG/ML IJ SOLN
INTRAMUSCULAR | Status: AC
  Filled 2013-03-22: qty 1

## 2013-03-22 MED ORDER — OXYCODONE-ACETAMINOPHEN 5-325 MG PO TABS
1.0000 | ORAL_TABLET | ORAL | Status: DC | PRN
  Administered 2013-03-22: 1 via ORAL
  Filled 2013-03-22: qty 1

## 2013-03-22 MED ORDER — TERBUTALINE SULFATE 1 MG/ML IJ SOLN: 0.2500 mg | Freq: Once | INTRAMUSCULAR | Status: DC | PRN

## 2013-03-22 MED ORDER — MISOPROSTOL 200 MCG PO TABS
ORAL_TABLET | ORAL | Status: AC
  Filled 2013-03-22: qty 4

## 2013-03-22 MED ORDER — WITCH HAZEL-GLYCERIN EX PADS: 1.0000 "application " | MEDICATED_PAD | CUTANEOUS | Status: DC | PRN

## 2013-03-22 MED ORDER — ONDANSETRON HCL 4 MG/2ML IJ SOLN: 4.0000 mg | Freq: Four times a day (QID) | INTRAMUSCULAR | Status: DC | PRN

## 2013-03-22 MED ORDER — LACTATED RINGERS IV SOLN
INTRAVENOUS | Status: DC
  Administered 2013-03-22: 08:00:00 via INTRAVENOUS

## 2013-03-22 MED ORDER — TETANUS-DIPHTH-ACELL PERTUSSIS 5-2.5-18.5 LF-MCG/0.5 IM SUSP: 0.5000 mL | Freq: Once | INTRAMUSCULAR | Status: DC

## 2013-03-22 MED ORDER — LANOLIN HYDROUS EX OINT: TOPICAL_OINTMENT | CUTANEOUS | Status: DC | PRN

## 2013-03-22 MED ORDER — OXYTOCIN BOLUS FROM INFUSION: 500.0000 mL | INTRAVENOUS | Status: DC

## 2013-03-22 MED ORDER — ZOLPIDEM TARTRATE 5 MG PO TABS: 5.0000 mg | ORAL_TABLET | Freq: Every evening | ORAL | Status: DC | PRN

## 2013-03-22 MED ORDER — OXYTOCIN 40 UNITS IN LACTATED RINGERS INFUSION - SIMPLE MED
62.5000 mL/h | INTRAVENOUS | Status: DC
  Administered 2013-03-22: 62.5 mL/h via INTRAVENOUS

## 2013-03-22 MED ORDER — CITRIC ACID-SODIUM CITRATE 334-500 MG/5ML PO SOLN: 30.0000 mL | ORAL | Status: DC | PRN

## 2013-03-22 MED ORDER — IBUPROFEN 600 MG PO TABS
600.0000 mg | ORAL_TABLET | Freq: Four times a day (QID) | ORAL | Status: DC | PRN
  Administered 2013-03-22: 600 mg via ORAL
  Filled 2013-03-22: qty 1

## 2013-03-22 NOTE — H&P (Signed)
Carly Graham is a 32 y.o. female presenting for IOL 2/2 Mono/di twins and advanced cervical dilation. Maternal Medical History:  Reason for admission: Nausea.  Prenatal Complications - Diabetes: none.    OB History   Grav Para Term Preterm Abortions TAB SAB Ect Mult Living   3 1 1  1 1    1      Past Medical History  Diagnosis Date  . Medical history non-contributory    Past Surgical History  Procedure Laterality Date  . No past surgeries     Family History: family history is negative for Alcohol abuse, Arthritis, Asthma, Birth defects, Cancer, COPD, Depression, Diabetes, Drug abuse, Early death, Hearing loss, Heart disease, Hyperlipidemia, Hypertension, Kidney disease, Learning disabilities, Mental illness, Mental retardation, Miscarriages / Stillbirths, Stroke, and Vision loss. Social History:  reports that she has never smoked. She has never used smokeless tobacco. She reports that she does not drink alcohol or use illicit drugs.   Prenatal Transfer Tool  Maternal Diabetes: No Genetic Screening: Normal Maternal Ultrasounds/Referrals: Normal Fetal Ultrasounds or other Referrals:  None Maternal Substance Abuse:  No Significant Maternal Medications:  None Significant Maternal Lab Results:  None Other Comments:  Mono/di twins   Review of Systems  Constitutional: Negative for fever.  Eyes: Negative for blurred vision.  Cardiovascular: Negative for chest pain.  Gastrointestinal: Negative for nausea, vomiting and abdominal pain.  Genitourinary: Negative for dysuria and urgency.  Neurological: Negative for dizziness and headaches.    Dilation: 4.5 Effacement (%): 80 Station: -1 Exam by:: Thressa Sheller CNM Blood pressure 115/68, pulse 80, temperature 97.9 F (36.6 C), temperature source Oral, resp. rate 18, weight 81.647 kg (180 lb), last menstrual period 07/05/2012. Maternal Exam:  Uterine Assessment: Contraction strength is mild.  Contraction duration is 30  seconds. Contraction frequency is irregular.   Abdomen: Fetal presentation: vertex  Introitus: Normal vulva. Normal vagina.  Pelvis: adequate for delivery.   Cervix: Cervix evaluated by digital exam.     Fetal Exam Fetal Monitor Review: Mode: ultrasound.   Baseline rate: Baby A 135, Baby B 140.  Variability: moderate (6-25 bpm).   Pattern: accelerations present and no decelerations.    Fetal State Assessment: Category I - tracings are normal.     Physical Exam  Nursing note and vitals reviewed. Constitutional: She is oriented to person, place, and time. She appears well-developed and well-nourished. No distress.  Cardiovascular: Normal rate.   Respiratory: Effort normal.  GI: Soft. There is no tenderness.  Genitourinary:  4-5/80/-1 Baby A is vertex   Neurological: She is alert and oriented to person, place, and time.  Skin: Skin is warm.  Psychiatric: She has a normal mood and affect.    Prenatal labs: ABO, Rh: O/Positive/-- (03/11 0000) Antibody: Negative (03/11 0000) Rubella: Immune (03/11 0000) RPR: NON REAC (07/07 1125)  HBsAg: Negative (03/11 0000)  HIV: NON REACTIVE (07/07 1125)  GBS: Negative (08/18 0000)   Assessment/Plan: IOL Mono/di twins Pitocin 1x1 Anticipate NSVB   Tawnya Crook 03/22/2013, 7:33 AM

## 2013-03-22 NOTE — Progress Notes (Addendum)
Carly Graham is a 32 y.o. G3P1011 at [redacted]w[redacted]d admitted for induction of labor due to mono di twins.  Subjective: Pt feeling pressure but otherwise is comfortable. No epidural at this time.  Objective: BP 110/80  Pulse 74  Temp(Src) 98.4 F (36.9 C) (Oral)  Resp 18  Ht 4\' 10"  (1.473 m)  Wt 81.647 kg (180 lb)  BMI 37.63 kg/m2  LMP 07/05/2012      FHT:  FHR: 130s, 140s bpm, variability: moderate,  accelerations:  Present,  decelerations:  Absent, baby A after rupture had a run of 3 variable decels with contx. Resolved  UC:   regular, every 2 minutes SVE:   Dilation: 6 Effacement (%): 80 Station: -1 Exam by:: Odum  Labs: Lab Results  Component Value Date   WBC 7.9 03/22/2013   HGB 11.0* 03/22/2013   HCT 32.7* 03/22/2013   MCV 86.5 03/22/2013   PLT 175 03/22/2013    Assessment / Plan: Induction of labor due to multifetal gestation,  progressing well on pitocin  Labor: on pitocin at 4 s/p AROM at 1400 Preeclampsia:  no signs or symptoms of toxicity Fetal Wellbeing:  Category I and Category II reactive and reassuring. 3 variabile decels after AROM Pain Control:  Labor support without medications at this time. I/D:  n/a Anticipated MOD:  NSVD  Reed Eifert RYAN 03/22/2013, 1:18 PM

## 2013-03-23 NOTE — Progress Notes (Signed)
Post Partum Day 1  Subjective: up ad lib, voiding and tolerating PO  Objective: Blood pressure 114/73, pulse 69, temperature 97.9 F (36.6 C), temperature source Oral, resp. rate 19, height 4\' 10"  (1.473 m), weight 81.647 kg (180 lb), last menstrual period 07/05/2012, SpO2 100.00%, unknown if currently breastfeeding.  Physical Exam:  General: alert, cooperative and no distress Lochia: appropriate Uterine Fundus: firm Incision: no incision  DVT Evaluation: No evidence of DVT seen on physical exam. Negative Homan's sign. No cords or calf tenderness. Calf/Ankle edema is present.   Recent Labs  03/22/13 0705  HGB 11.0*  HCT 32.7*    Assessment/Plan: Plan for discharge tomorrow, Discharge home and Breastfeeding. Patient would like to have Mirena for contraception.    LOS: 1 day   Coy Saunas 03/23/2013, 7:43 AM   I have seen and examined this patient and agree with above documentation in the PA student's note.   Rulon Abide, M.D. [facility] Fellow 03/23/2013 10:43 AM

## 2013-03-23 NOTE — Lactation Note (Signed)
This note was copied from the chart of GirlA Darletta Anguiano Jaimes. Lactation Consultation Note  Patient Name: Dalila Arca HYQMV'H Date: 03/23/2013 Reason for consult: Follow-up assessment;Infant < 6lbs;Multiple gestation Mom reports baby's have been sleepy at the breast. She reports the last good feeding was at 0600 this am. Mom reports hearing swallows with the 0600 feeding. At this visit, Baby A is sleepy, Baby B is giving feeding ques. Mom reports baby's will latch, take few suckles then stop. Mom was trying to latch Baby B in cradle hold, changed to cross cradle and assisted Mom to latch Baby B. She could obtain good depth, would BF for few minutes, then become sleepy. Demonstrated to Mom how to stimulate baby to keep awake. Would have to re-latch to maintain depth. Baby nursed on and off for approx 10 minutes then came off the breast. Assisted Mom with latching Baby A in football hold to right breast. It took several attempts to get baby to latch. Baby A nursed off an on for 10 minutes. Would get sleepy at the breast requiring re-latching. Baby B then decided to nurse again. Assisted Mom to tandem BF in football hold. Discussed with Mom the need to keep baby's BF for greater than 10 minutes now that they are greater than 24 hours old, breastfeeding with feeding ques, at least every 3 hours.  Discussed post pumping to encourage milk production and giving supplement to baby. Set up DEBP for Mom. Advised to post pump on preemie setting for 15 minutes. Also discussed supplementing with formula if babies will not stay awake at the breast, or if we are not hearing swallows with feedings. Mom is not ready to supplement with formula at this time. Advised Mom to call for assist with feedings. Maday, the Spanish interpreter present for visit.   Maternal Data    Feeding Feeding Type: Breast Milk Length of feed: 10 min (off and on)  LATCH Score/Interventions Latch: Repeated attempts  needed to sustain latch, nipple held in mouth throughout feeding, stimulation needed to elicit sucking reflex. Intervention(s): Adjust position;Assist with latch;Breast massage;Breast compression  Audible Swallowing: None  Type of Nipple: Everted at rest and after stimulation  Comfort (Breast/Nipple): Soft / non-tender     Hold (Positioning): Assistance needed to correctly position infant at breast and maintain latch.  LATCH Score: 6  Lactation Tools Discussed/Used Tools: Pump Breast pump type: Double-Electric Breast Pump WIC Program: No Pump Review: Setup, frequency, and cleaning Initiated by:: Earley Favor, RN IBCLC Date initiated:: 03/23/13   Consult Status Consult Status: Follow-up Date: 03/23/13 Follow-up type: In-patient    Alfred Levins 03/23/2013, 8:41 PM

## 2013-03-23 NOTE — Progress Notes (Signed)
UR chart review completed.  

## 2013-03-24 DIAGNOSIS — O3090 Multiple gestation, unspecified, unspecified trimester: Secondary | ICD-10-CM

## 2013-03-24 DIAGNOSIS — O329XX0 Maternal care for malpresentation of fetus, unspecified, not applicable or unspecified: Secondary | ICD-10-CM

## 2013-03-24 DIAGNOSIS — O30009 Twin pregnancy, unspecified number of placenta and unspecified number of amniotic sacs, unspecified trimester: Secondary | ICD-10-CM

## 2013-03-24 MED ORDER — IBUPROFEN 600 MG PO TABS: 600.0000 mg | ORAL_TABLET | Freq: Four times a day (QID) | ORAL | Status: DC

## 2013-03-24 NOTE — Discharge Summary (Signed)
Attestation of Attending Supervision of Obstetric Fellow: Evaluation and management procedures were performed by the Obstetric Fellow under my supervision and collaboration.  I have reviewed the Obstetric Fellow's note and chart, and I agree with the management and plan.  Teodora Baumgarten, MD, FACOG Attending Obstetrician & Gynecologist Faculty Practice, [facility]   

## 2013-03-24 NOTE — Discharge Summary (Signed)
Obstetric Discharge Summary Reason for Admission: induction of labor Prenatal Procedures: none Intrapartum Procedures: spontaneous vaginal delivery of mono/di twins Postpartum Procedures: none Complications-Operative and Postpartum: 2nd degree perineal laceration Hemoglobin  Date Value Range Status  03/22/2013 11.0* 12.0 - 15.0 g/dL Final     HCT  Date Value Range Status  03/22/2013 32.7* 36.0 - 46.0 % Final    Physical Exam:  General: alert, cooperative and no distress Lochia: appropriate Uterine Fundus: firm Incision: no incision  DVT Evaluation: No evidence of DVT seen on physical exam. Negative Homan's sign. No cords or calf tenderness. Calf/Ankle edema is present.  Discharge Diagnoses: Term Pregnancy-delivered  Discharge Information: Date: 03/24/2013 Activity: pelvic rest Diet: routine Medications: Ibuprofen Condition: stable Instructions: refer to practice specific booklet Discharge to: home   Newborn Data:   Sharese, Manrique [[id number]]  Live born female  Birth Weight: 5 lb 5.4 oz (2421 g) APGAR: 9, [address] Sarabelle, Genson [[id number]]  Live born female  Birth Weight: 7 lb 3.2 oz (3265 g) APGAR: 8, 9   Newborns will likely stay in the hospital for further observation. as they are not eating well.   Coy Saunas 03/24/2013, 7:38 AM  I have seen and examined this patient and agree with above documentation in the PA student's note. Pt presented as an IOL at 37weeks for mono/di twins. She progressed and delivered via SVD two girls as above. Her post partum care was uncomplicated. She will f/u in clinic in 4-6 weeks and desires an IUD at that time.  Pt is breast feeding also.   Rulon Abide, M.D. [facility] Fellow 03/24/2013 8:03 AM

## 2013-04-04 NOTE — H&P (Signed)
Attestation of Attending Supervision of Advanced Practitioner (CNM/NP): Evaluation and management procedures were performed by the Advanced Practitioner under my supervision and collaboration. I have reviewed the Advanced Practitioner's note and chart, and I agree with the management and plan.  Rozetta Stumpp H. 11:46 AM

## 2013-05-03 ENCOUNTER — Encounter: Payer: Self-pay | Admitting: Obstetrics & Gynecology

## 2013-05-03 ENCOUNTER — Encounter: Payer: Self-pay | Admitting: Family Medicine

## 2013-05-03 ENCOUNTER — Ambulatory Visit (INDEPENDENT_AMBULATORY_CARE_PROVIDER_SITE_OTHER): Payer: Self-pay | Admitting: Family Medicine

## 2013-05-03 MED ORDER — DEXTROMETHORPHAN HBR 15 MG/5ML PO SYRP: 10.0000 mL | ORAL_SOLUTION | Freq: Four times a day (QID) | ORAL | Status: DC | PRN

## 2013-05-03 NOTE — Progress Notes (Signed)
Patient ID: Carly Graham, female   DOB: 01-Jul-1981, 32 y.o.   MRN: [id number] Subjective:    Carly Graham is a 32 y.o. G62P2013 Hispanic female who presents for a postpartum visit. She is a few weeks postpartum following a spontaneous vaginal delivery. I have fully reviewed the prenatal and intrapartum course. Pt presented as an IOL at 37weeks for mono/di twins. She progressed and delivered via SVD two girls.     Postpartum course has been uncomplicated. Both babies course has been uncomplicated. Baby is feeding by breast. Bleeding no bleeding. Bowel function is normal. Bladder function is normal. Patient is not sexually active. Contraception method is wanting an IUD. Pt is self pay.  Postpartum depression screening: negative.  The following portions of the patient's history were reviewed and updated as appropriate: allergies, current medications, past medical history, past surgical history and problem list.  Review of Systems Pertinent items are noted in HPI.   Filed Vitals:   05/03/13 1300  BP: 106/78  Pulse: 79  Temp: 97.9 F (36.6 C)  Height: 4' 9.5" (1.461 m)  Weight: 153 lb 9.6 oz (69.673 kg)    Objective:     General:  alert, cooperative and no distress   Breasts:  deferred, no complaints  Lungs: clear to auscultation bilaterally  Heart:  regular rate and rhythm  Abdomen: soft, nontender   Vulva: normal  Vagina: normal vagina  Cervix:  closed  Corpus: Well-involuted  Adnexa:  Non-palpable  Rectal Exam: normal hemorrhoids        Assessment:   normal postpartum exam 6 wks s/p SVD of twins Depression screening Contraception counseling   Plan:   Contraception: desires mirena. application for free device given. advised will need to pay administration fee. offered other forms but pt would like to wait for IUD Follow up prn

## 2013-05-03 NOTE — Progress Notes (Signed)
Here for postpartum check. C/o cough and fatigue. C/o itchy rash last few days, and hasn't take new medicine. States the same thing happened last time after childbirth. States doesn't have much rash right now- always starts on neck.

## 2013-05-03 NOTE — Patient Instructions (Signed)
Try benadryl for the itching   Informacin sobre el dispositivo intrauterino  (Intrauterine Device Information) El dispositivo intrauterino (DIU) se inserta en el tero e impide el embarazo. Hay dos tipos de DIU:   DIU de cobre. Este tipo de DIU est recubierto con un alambre de cobre y se inserta dentro del tero. El cobre hace que el tero y las trompas de [place] produzcan un liquido que [employer] espermatozoides. El DIU de cobre puede Geneticist, molecular durante 10 aos.  DIU hormonal. Este tipo de DIU contiene la hormona progestina (progesterona sinttica). La hormona espesa el moco cervical y evita que los espermatozoides ingresen al tero y tambin afina la membrana que cubre el tero para evitar la implantacin del vulo fertilizado. La hormona debilita o destruye los espermatozoides que ingresan al tero. El DIU hormonal puede Geneticist, molecular durante 5 aos. El mdico se asegurar de que usted es una buena candidata para usar el DIU cono anticonceptivo. Hable con su mdico acerca de los posibles efectos secundarios.  VENTAJAS  Es muy eficaz, reversible, de accin prolongada y de bajo mantenimiento.  No hay efectos secundarios relacionados con el estrgeno.  El DIU puede ser utilizado durante la Market [employer].  No est asociado con el aumento de [place].  Funciona inmediatamente despus de la insercin.  El DIU de cobre no interfiere con las hormonas femeninas.  El DIU con progesterona puede hacer que los perodos menstruales no sean tan abundantes.  El DIU de progesterona puede usarse durante 5 aos.  El DIU de cobre puede usarse durante 10 aos. DESVENTAJAS  El DIU de progesterona puede estar asociado con patrones de sangrado irregular.  El DIU de cobre puede hacer que el flujo menstrual ms abundante y doloroso.  Puede experimentar clicos y sangrado vaginal despus de la insercin. Document Released: 12/31/2009 Document Revised: 10/05/2011 [facility] Patient  Information 2014 [place], [place].

## 2013-06-02 ENCOUNTER — Ambulatory Visit (INDEPENDENT_AMBULATORY_CARE_PROVIDER_SITE_OTHER): Payer: Medicaid Other | Admitting: Obstetrics & Gynecology

## 2013-06-02 ENCOUNTER — Encounter: Payer: Self-pay | Admitting: Obstetrics & Gynecology

## 2013-06-02 ENCOUNTER — Encounter: Payer: Self-pay | Admitting: *Deleted

## 2013-06-02 VITALS — BP 110/72 | HR 79 | Temp 98.0°F | Ht <= 58 in | Wt 158.7 lb

## 2013-06-02 DIAGNOSIS — Z30430 Encounter for insertion of intrauterine contraceptive device: Secondary | ICD-10-CM

## 2013-06-02 MED ORDER — LEVONORGESTREL 20 MCG/24HR IU IUD
INTRAUTERINE_SYSTEM | Freq: Once | INTRAUTERINE | Status: AC
  Administered 2013-06-02: 09:00:00 via INTRAUTERINE

## 2013-06-02 NOTE — Patient Instructions (Addendum)
Colocación de un dispositivo intrauterino - Cuidados posteriores   (Intrauterine Device Insertion, Care After)  Siga estas instrucciones durante las próximas semanas. Estas indicaciones le proporcionan información general acerca de cómo deberá cuidarse después del procedimiento. El médico también podrá darle instrucciones más específicas. El tratamiento se ha planificado de acuerdo a las prácticas médicas actuales, pero a veces se producen problemas. Comuníquese con el médico si tiene algún problema o si tiene preguntas después del procedimiento.   INSTRUCCIONES PARA EL CUIDADO EN EL HOGAR   · Solo tome medicamentos de venta libre o recetados para el dolor, malestar o fiebre, según las indicaciones del médico. No tome aspirina. Esto puede aumentar el sangrado.  · Revise su DIU para asegurarse de que esté en su lugar antes de reanudar la actividad sexual. Tiene que sentir los hilos. Si no los siente, algo puede estar mal. El DIU puede haberse salido del útero o éste puede haber sido atravesado (perforado) durante la colocación. Además, si los hilos son más largos, puede significar que el DIU se está saliendo del útero. No tendrá protección para evitar el embarazo si ocurre alguno de estos problemas.  · Puede volver a tener relaciones sexuales si no tiene problemas con el DIU. El DIU se considera efectivo de inmediato.  · Puede retomar sus actividades habituales.  · Cumpla con todos los controles para asegurarse de que su DIU se ha mantenido en el lugar. Después del primer examen, se recomienda realizar controles anuales, a menos que usted no pueda sentir los hilos de su DIU.  · Controle que el DIU sigue en su lugar sintiendo los hilos después de cada período menstrual.  SOLICITE ATENCIÓN MÉDICA SI:   · Tiene un sangrado más abundante o dura más de un ciclo menstrual normal.  · Tiene fiebre.  · Siente cólicos o dolor abdominal que no se alivian con medicamentos.  · Siente dolor abdominal que no parece estar  relacionado con el área en que sentía los cólicos y el dolor anteriormente.  · Se siente mareada, inusualmente débil o se desmaya.  · Tiene flujo vaginal u olores anormales.  · Tiene dolor durante las relaciones sexuales.  · No puede sentir los hilos del DIU o los hilos del DIU se han alargado.  · Siente que el DIU está en la abertura del cuello del útero, en la vagina.  · Piensa que está embarazada o no tiene su período menstrual.  · El hilo del DIU está lastimando a su pareja sexual.  Document Released: 04/06/2012  ExitCare® Patient Information ©2014 [employer].

## 2013-06-02 NOTE — Progress Notes (Signed)
GYNECOLOGY CLINIC PROCEDURE NOTE  Carly Graham is a 32 y.o. [phone] here for Mirena IUD insertion. No GYN concerns.  Last pap smear was on 10/03/12 at [facility] and was normal. Patient is Spanish-speaking only, Spanish interpreter present for this encounter.  IUD Insertion Procedure Note Patient identified, informed consent performed.  Discussed risks of irregular bleeding, cramping, infection, malpositioning or misplacement of the IUD outside the uterus which may require further procedures. Time out was performed.  Urine pregnancy test negative.  Speculum placed in the vagina.  Cervix visualized.  Cleaned with Betadine x 2.  Grasped anteriorly with a single tooth tenaculum.  Uterus sounded to 8 cm.  Mirena IUD placed per manufacturer's recommendations.  Strings trimmed to 3 cm. Tenaculum was removed, good hemostasis noted.  Patient tolerated procedure well.   Patient was given post-procedure instructions.  Patient was also asked to check IUD strings periodically and to call for any concerns.  RENEE KUNEFF, D.O Family Medicine Resident PGY-2   Attestation of Attending Supervision of Resident: Evaluation and management procedures were performed by the [facility] Medicine Resident under my supervision.  I have seen and examined the patient, reviewed the resident's note and chart, and I agree with the management and plan.  Jaynie Collins, MD, FACOG Attending Obstetrician & Gynecologist Faculty Practice, [facility]

## 2013-06-19 ENCOUNTER — Encounter: Payer: Self-pay | Admitting: Obstetrics & Gynecology

## 2013-06-19 ENCOUNTER — Ambulatory Visit (INDEPENDENT_AMBULATORY_CARE_PROVIDER_SITE_OTHER): Payer: Self-pay | Admitting: Obstetrics & Gynecology

## 2013-06-19 VITALS — BP 107/70 | HR 75 | Temp 98.0°F | Ht <= 58 in

## 2013-06-19 DIAGNOSIS — Z30431 Encounter for routine checking of intrauterine contraceptive device: Secondary | ICD-10-CM

## 2013-06-19 NOTE — Progress Notes (Signed)
GYNECOLOGY CLINIC ENCOUNTER NOTE  History:  32 y.o. [id number] here today for complaints with the Mirena IUD placed on 06/02/13.  Patient is Spanish-speaking only, Spanish interpreter present for this encounter.  She reports has irregular bleeding and cramping.  Also husband feels strings during intercourse,  got poked during intercourse. Patient is worried about malposition within uterus, also is worried about efficacy as she just had twin girls.    The following portions of the patient's history were reviewed and updated as appropriate: allergies, current medications, past family history, past medical history, past social history, past surgical history and problem list.  Normal pap smear at [facility] on 10/03/12.  Review of Systems:  Pertinent items are noted in HPI.  Objective:  Physical Exam BP 107/70  Pulse 75  Temp(Src) 98 F (36.7 C) (Oral)  Ht 4' 9.5" (1.461 m)  Breastfeeding? Yes Gen: NAD Abd: Soft, nontender and nondistended Pelvic: Normal appearing external genitalia; normal appearing vaginal mucosa and cervix.  Bloody discharge.  Mirena IUD strings visualized about 3 cm in length. End of the strings tucked into the cervical canal using Kelly forceps.  No uterine or adnexal tenderness.   Assessment & Plan:  Offered ultrasound, patient wants to wait for now. If pain gets worse, will order ultrasound. Assured about 99.5% efficacy; emphasized that no contraception is 100% effective. Bleeding and pain precautions reviewed. Routine preventative health maintenance measures emphasized.     Jaynie Collins, MD, FACOG Attending Obstetrician & Gynecologist Faculty Practice, [facility] [place]

## 2013-06-19 NOTE — Progress Notes (Signed)
Used language line as no interpreter was available. Pt states that she is here today because she wants to have her iud checked and she is bleeding and it won't stop. States that a nurse told her that it was normal  But she wants a doctor to check it out. She tried to have intercourse with her husband but he got a wound from the strings.

## 2013-08-07 ENCOUNTER — Other Ambulatory Visit: Payer: Self-pay | Admitting: Obstetrics & Gynecology

## 2013-08-07 ENCOUNTER — Telehealth: Payer: Self-pay

## 2013-08-07 DIAGNOSIS — N938 Other specified abnormal uterine and vaginal bleeding: Secondary | ICD-10-CM

## 2013-08-07 MED ORDER — ESTRADIOL 1 MG PO TABS: 1.0000 mg | ORAL_TABLET | Freq: Every day | ORAL | Status: AC

## 2013-08-07 NOTE — Telephone Encounter (Signed)
Pt. Came in to clinic and spoke with Tobi BastosAnna. Pt. Explained that she bleeding and still having pain from IUD insertion in November. Pt. States now she believes it is affecting her life. She is spotting and goes through one pad a day. Spoke with Dr. Burnice LoganHarrawayKatrinka Blazing- Smith who reviewed pt.'s chart and decided to prescribe Estriadol for pt.'s bleeding in hopes of helping with bleeding and pain. Explained to patient that she can take this medication once a day and that it will take a few days for the medication to begin to work and stop bleeding. Informed pt. To call clinic if her symptoms do not stop in a month or if her symptoms get worst. Pt. Verbalized understanding and had no other questions.

## 2013-10-23 ENCOUNTER — Ambulatory Visit: Payer: Self-pay | Admitting: Obstetrics & Gynecology

## 2014-05-28 ENCOUNTER — Encounter: Payer: Self-pay | Admitting: Obstetrics & Gynecology

## 2015-07-03 IMAGING — US US OB FOLLOW-UP EACH ADDL GEST (MODIFY)
1 series · 14 of 28 positions shown · non-contrast
Comparison: none

[Series 1: us ob follow-up each addl gest (modify) · 0.21mm/px · 14 of 90 slices shown]
[im 4/90]
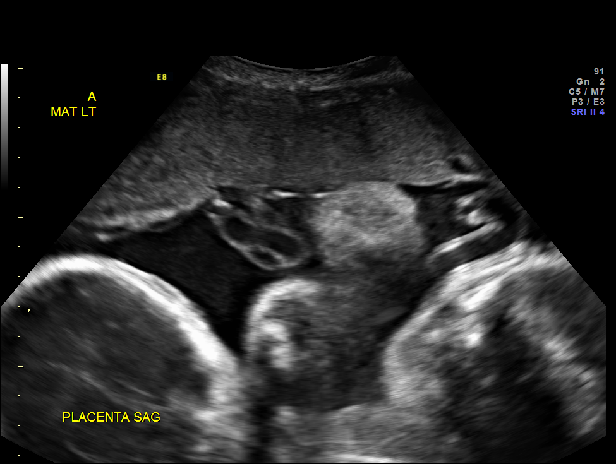
[im 10/90]
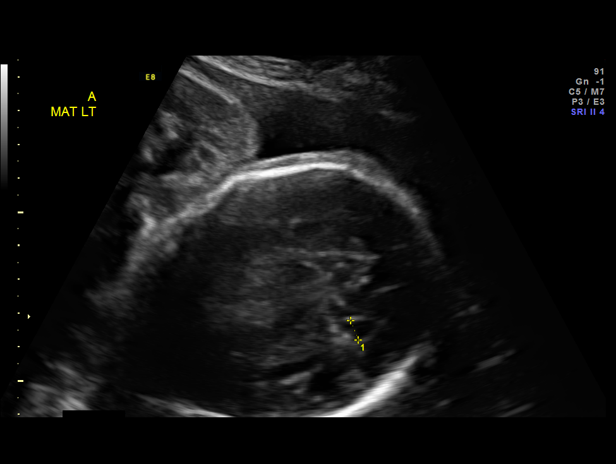
[im 17/90]
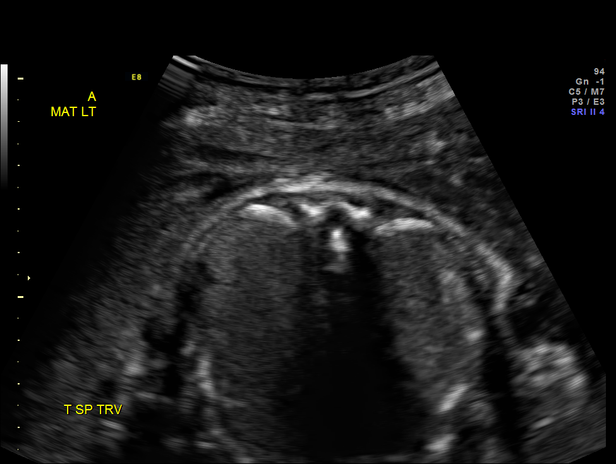
[im 24/90]
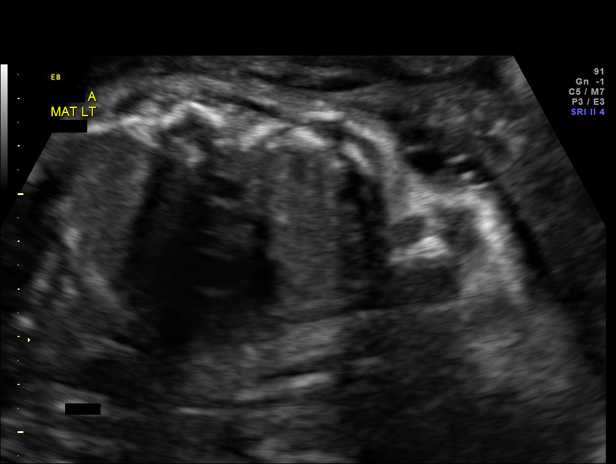
[im 30/90]
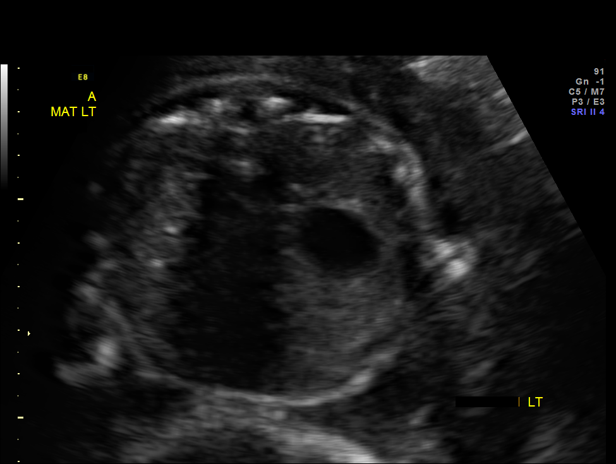
[im 37/90]
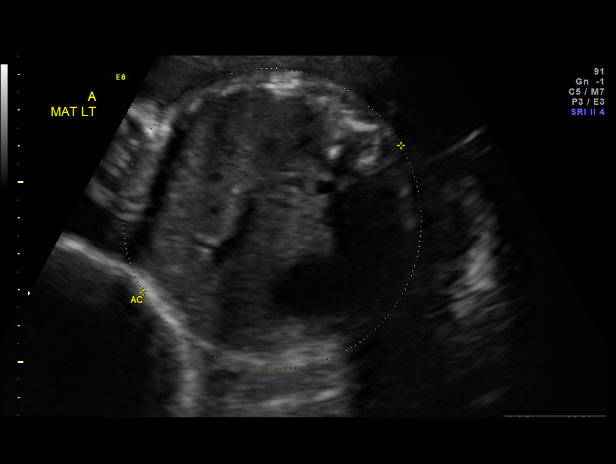
[im 43/90]
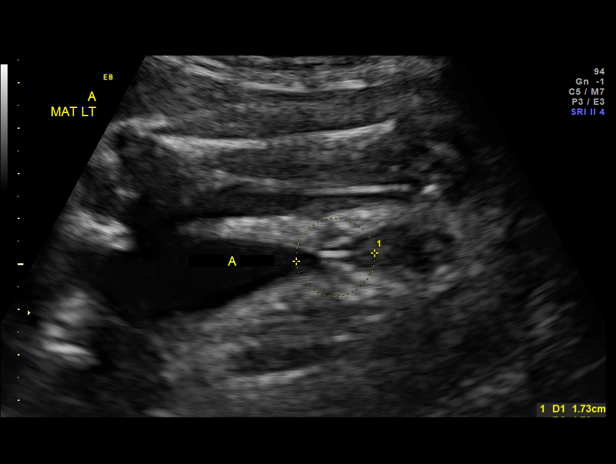
[im 50/90]
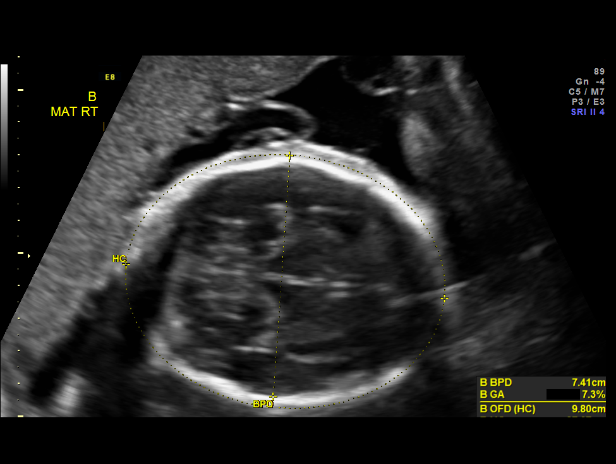
[im 57/90]
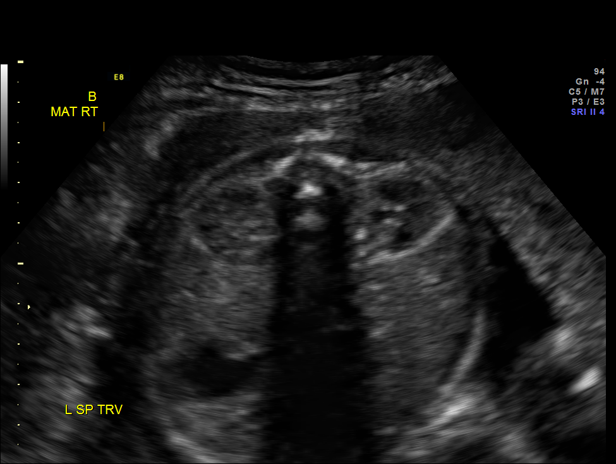
[im 63/90]
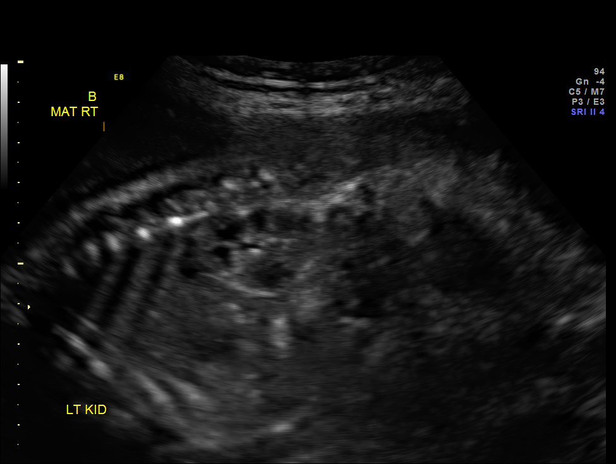
[im 70/90]
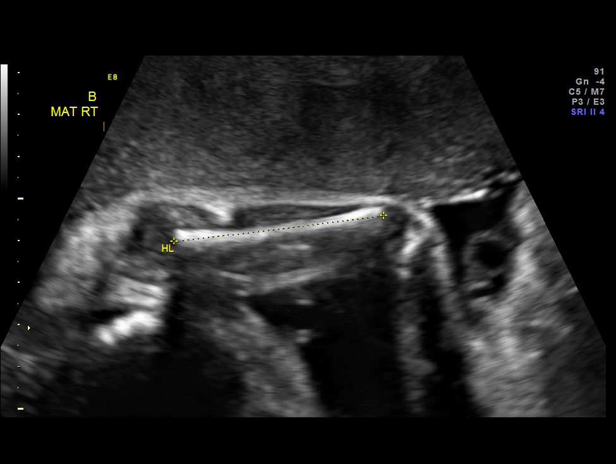
[im 76/90]
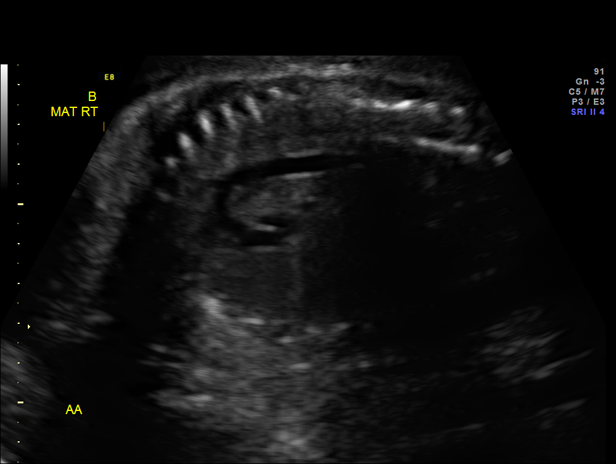
[im 83/90]
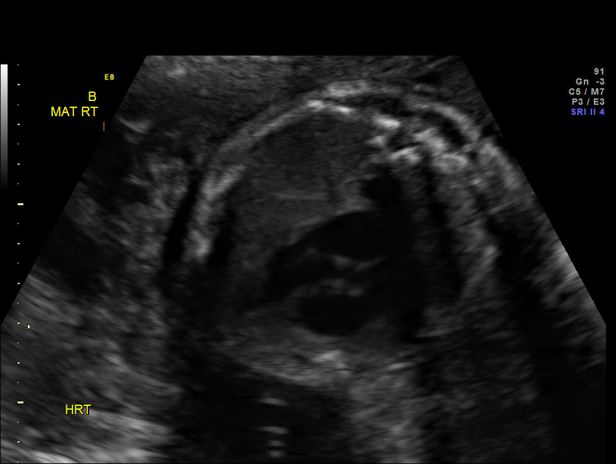
[im 90/90]
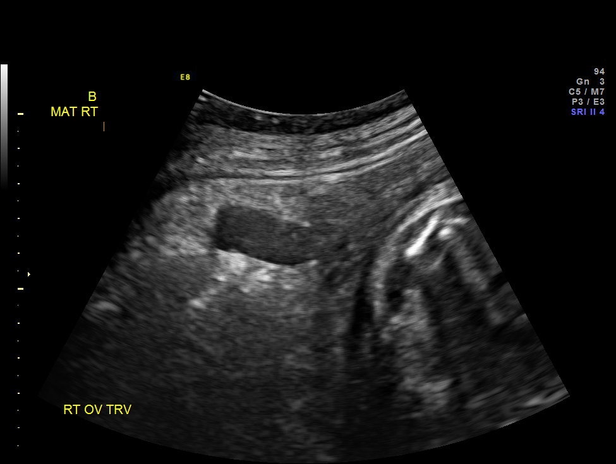

[14 of 28 positions shown; findings below may reference images not displayed]

OBSTETRICS REPORT
                      (Signed Final 02/08/2013 [DATE])

             MARIBELLA

Service(s) Provided

 US OB FOLLOW UP                                       76816.1
 US OB FOLLOW UP ADDL GEST                             76816.2
Indications

 Twin gestation, [place]uk[place]
 Follow-up incomplete fetal anatomic evaluation
 Left Ovarian cyst (simple)
Fetal Evaluation (Fetus A)

 Num Of Fetuses:    2
 Fetal Heart Rate:  144                          bpm
 Cardiac Activity:  Observed
 Fetal Lie:         Left Fetus
 Presentation:      Cephalic
 Placenta:          Anterior, above cervical os
 P. Cord            Previously Visualized
 Insertion:

 Membrane Desc:     Dividing
                    Membrane seen
                    - Monochorionic

 Amniotic Fluid
 AFI FV:      Subjectively within normal limits
                                             Larg Pckt:     4.8  cm
Biometry (Fetus A)

 BPD:     75.2  mm     G. Age:  30w 1d                CI:         80.0   70 - 86
 OFD:       94  mm                                    FL/HC:      20.2   19.3 -

 HC:     271.1  mm     G. Age:  29w 4d      < 3  %    HC/AC:      1.03   0.96 -

 AC:     262.9  mm     G. Age:  30w 3d       26  %    FL/BPD:     72.9   71 - 87
 FL:      54.8  mm     G. Age:  28w 6d      < 3  %    FL/AC:      20.8   20 - 24
 HUM:     48.3  mm     G. Age:  28w 2d      < 5  %

 Est. FW:    8602  gm      3 lb 4 oz     29  %     FW Discordancy        14  %
Gestational Age (Fetus A)
 LMP:           31w 1d        Date:  07/05/12                 EDD:   04/11/13
 U/S Today:     29w 5d                                        EDD:   04/21/13
 Best:          31w 1d     Det. By:  LMP  (07/05/12)          EDD:   04/11/13
Anatomy (Fetus A)

 Cranium:          Appears normal         Aortic Arch:      Appears normal
 Fetal Cavum:      Previously seen        Ductal Arch:      Not well visualized
 Ventricles:       Appears normal         Diaphragm:        Appears normal
 Choroid Plexus:   Previously seen        Stomach:          Appears normal, left
                                                            sided
 Cerebellum:       Previously seen        Abdomen:          Appears normal
 Posterior Fossa:  Previously seen        Abdominal Wall:   Previously seen
 Nuchal Fold:      Not applicable (>20    Cord Vessels:     Appears normal (3
                   wks GA)                                  vessel cord)
 Face:             Not well visualized    Kidneys:          Appear normal
 Lips:             Not well visualized    Bladder:          Appears normal
 Heart:            Appears normal         Spine:            Appears normal
                   (4CH, axis, and
                   situs)
 RVOT:             Not well visualized    Lower             Previously seen
                                          Extremities:
 LVOT:             Not well visualized    Upper             Visualized
                                          Extremities:

 Other:  Fetus appears to be a female. Technically difficult due to advanced
         GA and fetal position.

Fetal Evaluation (Fetus B)

 Num Of Fetuses:    2
 Fetal Heart Rate:  141                          bpm
 Cardiac Activity:  Observed
 Fetal Lie:         Right Fetus
 Presentation:      Breech
 Placenta:          Anterior, above cervical os
 P. Cord            Previously Visualized
 Insertion:

 Membrane Desc:     Dividing
                    Membrane seen
                    - Monochorionic

 Amniotic Fluid
 AFI FV:      Subjectively within normal limits
                                             Larg Pckt:     6.0  cm
Biometry (Fetus B)

 BPD:       75  mm     G. Age:  30w 1d                CI:         77.8   70 - 86
 OFD:     96.4  mm                                    FL/HC:      20.8   19.3 -

 HC:     274.8  mm     G. Age:  30w 0d      < 3  %    HC/AC:      0.97   0.96 -

 AC:       282  mm     G. Age:  32w 1d       78  %    FL/BPD:     76.1   71 - 87
 FL:      57.1  mm     G. Age:  30w 0d       10  %    FL/AC:      20.2   20 - 24
 HUM:       50  mm     G. Age:  29w 2d       13  %

 Est. FW:    3191  gm    3 lb 12 oz      55  %     FW Discordancy     0 \ 14 %
Gestational Age (Fetus B)

 LMP:           31w 1d        Date:  07/05/12                 EDD:   04/11/13
 U/S Today:     30w 4d                                        EDD:   04/15/13
 Best:          31w 1d     Det. By:  LMP  (07/05/12)          EDD:   04/11/13
Anatomy (Fetus B)

 Cranium:          Appears normal         Aortic Arch:      Appears normal
 Fetal Cavum:      Previously seen        Ductal Arch:      Not well visualized
 Ventricles:       Appears normal         Diaphragm:        Appears normal
 Choroid Plexus:   Previously seen        Stomach:          Appears normal, left
                                                            sided
 Cerebellum:       Previously seen        Abdomen:          Appears normal
 Posterior Fossa:  Previously seen        Abdominal Wall:   Not well visualized
 Nuchal Fold:      Not applicable (>20    Cord Vessels:     Appears normal (3
                   wks GA)                                  vessel cord)
 Face:             Not well visualized    Kidneys:          Appear normal
 Lips:             Previously seen        Bladder:          Appears normal
 Heart:            Appears normal         Spine:            Appears normal
                   (4CH, axis, and
                   situs)
 RVOT:             Not well visualized    Lower             Previously seen
                                          Extremities:
 LVOT:             Not well visualized    Upper             Previously seen
                                          Extremities:

 Other:  Fetus appears to be a female. Technically difficult due to advanced
         GA and fetal position.
Cervix Uterus Adnexa

 Cervix:       Not visualized (advanced GA >93wks)

 Left Ovary:    Simple cyst measuring 2.7 x 1.8 x 1.7 cm
 Right Ovary:   Within normal limits.

 Adnexa:     No abnormality visualized.
Impression

 MC/DA twin gestation with best dates of 31 [DATE] weeks
 29% inter twin growth discordance is noted

 Twin A:
 Maternal left, cephalic, anterior placenta
 Estimated fetal weight at the 29th %tile.
 No anomalies identied; limited views of the anatomy obtained
 due to late gestational age and position
 Normal amniotic fluid volume

 Twin B:
 Maternal right, breech, anterior placenta
 Estimated fetal weight at the 55th %tile.
 No anomalies identified; limited views of the anatomy
 obtained due to late gestational age and position
 Normal amniotic fluid volume
Recommendations

 The patient is unable to be seen for 2x weekly NSTs due to
 transportation issues.  She will return for weekly BPPs and
 fluid checks.
 Follow up ultrasound for interval growth in 3-4 weeks.

 Recommend delivery at 37 weeks if testing otherwise
 reassuring.

 MUHE GABII SABIRO with us.  Please do not hesitate to
